# Patient Record
Sex: Female | Born: 1955 | Race: White | Hispanic: No | Marital: Married | Smoking: Never smoker
Health system: Southern US, Community
[De-identification: ages and names within clinical notes are randomized; demographics above are authoritative.]

## PROBLEM LIST (undated history)

## (undated) DIAGNOSIS — S2249XA Multiple fractures of ribs, unspecified side, initial encounter for closed fracture: Secondary | ICD-10-CM

## (undated) DIAGNOSIS — J939 Pneumothorax, unspecified: Secondary | ICD-10-CM

## (undated) DIAGNOSIS — Z89511 Acquired absence of right leg below knee: Secondary | ICD-10-CM

---

## 2000-06-18 ENCOUNTER — Encounter: Admission: RE | Admit: 2000-06-18 | Discharge: 2000-06-18 | Payer: Self-pay | Admitting: Family Medicine

## 2000-06-18 ENCOUNTER — Encounter: Payer: Self-pay | Admitting: Family Medicine

## 2001-01-27 ENCOUNTER — Emergency Department (HOSPITAL_COMMUNITY): Admission: EM | Admit: 2001-01-27 | Discharge: 2001-01-27 | Payer: Self-pay | Admitting: Emergency Medicine

## 2002-07-31 ENCOUNTER — Encounter: Payer: Self-pay | Admitting: Family Medicine

## 2002-07-31 ENCOUNTER — Encounter: Admission: RE | Admit: 2002-07-31 | Discharge: 2002-07-31 | Payer: Self-pay | Admitting: Family Medicine

## 2005-01-04 ENCOUNTER — Emergency Department (HOSPITAL_COMMUNITY): Admission: EM | Admit: 2005-01-04 | Discharge: 2005-01-05 | Payer: Self-pay | Admitting: Emergency Medicine

## 2006-07-04 ENCOUNTER — Ambulatory Visit (HOSPITAL_COMMUNITY): Admission: RE | Admit: 2006-07-04 | Discharge: 2006-07-04 | Payer: Self-pay | Admitting: Gastroenterology

## 2006-07-04 ENCOUNTER — Encounter (INDEPENDENT_AMBULATORY_CARE_PROVIDER_SITE_OTHER): Payer: Self-pay | Admitting: Gastroenterology

## 2007-01-17 ENCOUNTER — Observation Stay (HOSPITAL_COMMUNITY): Admission: EM | Admit: 2007-01-17 | Discharge: 2007-01-18 | Payer: Self-pay | Admitting: Emergency Medicine

## 2007-04-10 ENCOUNTER — Inpatient Hospital Stay (HOSPITAL_COMMUNITY): Admission: RE | Admit: 2007-04-10 | Discharge: 2007-04-12 | Payer: Self-pay | Admitting: Orthopaedic Surgery

## 2009-12-30 ENCOUNTER — Encounter
Admission: RE | Admit: 2009-12-30 | Discharge: 2010-02-15 | Payer: Self-pay | Source: Home / Self Care | Attending: Orthopedic Surgery | Admitting: Orthopedic Surgery

## 2010-01-17 ENCOUNTER — Encounter
Admission: RE | Admit: 2010-01-17 | Discharge: 2010-02-15 | Payer: Self-pay | Source: Home / Self Care | Attending: Orthopedic Surgery | Admitting: Orthopedic Surgery

## 2010-02-21 ENCOUNTER — Ambulatory Visit: Payer: BC Managed Care – PPO | Attending: Orthopedic Surgery | Admitting: Physical Therapy

## 2010-02-21 DIAGNOSIS — M2550 Pain in unspecified joint: Secondary | ICD-10-CM | POA: Insufficient documentation

## 2010-02-21 DIAGNOSIS — R269 Unspecified abnormalities of gait and mobility: Secondary | ICD-10-CM | POA: Insufficient documentation

## 2010-02-21 DIAGNOSIS — R5381 Other malaise: Secondary | ICD-10-CM | POA: Insufficient documentation

## 2010-02-21 DIAGNOSIS — S88119A Complete traumatic amputation at level between knee and ankle, unspecified lower leg, initial encounter: Secondary | ICD-10-CM | POA: Insufficient documentation

## 2010-02-21 DIAGNOSIS — IMO0001 Reserved for inherently not codable concepts without codable children: Secondary | ICD-10-CM | POA: Insufficient documentation

## 2010-02-28 ENCOUNTER — Encounter: Payer: Self-pay | Admitting: Physical Therapy

## 2010-05-31 NOTE — Op Note (Signed)
NAME:  Shannon Henderson, Shannon Henderson            ACCOUNT NO.:  [id number]   MEDICAL RECORD NO.:  [id number]          PATIENT TYPE:  AMB   LOCATION:  ENDO                         FACILITY:  [facility]   PHYSICIAN:  Anselmo Rod, M.D.  DATE OF BIRTH:  [birth year]   DATE OF PROCEDURE:  07/04/2006  DATE OF DISCHARGE:                               OPERATIVE REPORT   PROCEDURE PERFORMED:  Colonoscopy with cold biopsies x1.   ENDOSCOPIST:  Anselmo Rod, M.D.   INSTRUMENT USED:  Pentax video colonoscope.   INDICATIONS FOR PROCEDURE:  55 year old white female undergoing  screening colonoscopy to rule out colonic polyps, masses, etc.   PREPROCEDURE PREPARATION:  Informed consent was procured from the  patient. The patient fasted for 4 hours prior to the procedure and  prepped with 20 Osmo pills the night before and 12 Osmo pills the  morning of the procedure.  The risks and benefits of the procedure  including a 10% miss rate of cancer and polyp were discussed with the  patient, as well.   PREPROCEDURE PHYSICAL:  Patient with stable vital signs.  Neck supple.  Chest clear to auscultation.  S1 and S2 regular.  Abdomen soft with  normal bowel sounds.   DESCRIPTION OF PROCEDURE:  The patient was placed in the left lateral  decubitus position and sedated with 50 mcg of Fentanyl and 5 mg of  Versed given intravenously in slow incremental doses. Once the patient  was adequately sedated and maintained on low flow oxygen and continuous  cardiac monitoring, the Pentax video colonoscope was advanced from the  rectum to the cecum.  The appendiceal orifice and ileocecal valve were  clearly visualized and photographed.  The terminal ileum appeared  healthy without lesions.  There was extensive diverticulosis noted in  the sigmoid colon.  Diverticula were noted in the right colon, as well.  Small internal hemorrhoids were seen on retroflexion. A small erosion  was biopsied from the mid right colon (cold biopsies  x1).  The patient  tolerated the procedure well without complications.   IMPRESSION:  1. Large sigmoid diverticula present with a few right sided      diverticula.  2. Small internal hemorrhoids.  3. Small erosion biopsied from the mid right colon.  4. Normal terminal ileum.   RECOMMENDATIONS:  1. Await pathology results.  2. Repeat colonoscopy depending on pathology results.  3. Avoid all nonsteroidals including aspirin for the next two weeks.  4. Brochures on diverticulosis along with a high fiber diet have been      given to the patient for education.  5. Outpatient follow-up as need arises in the future.      Anselmo Rod, M.D.  Electronically Signed     JNM/MEDQ  D:  07/04/2006  T:  07/04/2006  Job:  [id number]   cc:   Marjory Lies, M.D.  Fax: [phone]

## 2010-05-31 NOTE — Op Note (Signed)
NAMEBEVERLIE, KURIHARA NO.:  [id number]   MEDICAL RECORD NO.:  [id number]          PATIENT TYPE:  INP   LOCATION:  5039                         FACILITY:  [facility]   PHYSICIAN:  Mark C. Ophelia Charter, M.D.    DATE OF BIRTH:  [birth year]   DATE OF PROCEDURE:  04/10/2007  DATE OF DISCHARGE:                               OPERATIVE REPORT   PRE AND POSTOPERATIVE DIAGNOSIS:  Right distal tibial shaft delayed  union.   PROCEDURE:  Right tibial nail with proximal and distal interlock.   SURGEON:  Annell Greening, MD   ANESTHESIA:  General plus Marcaine local.   TOURNIQUET TIME:  55 minutes.   ASSISTANT:  Maud Deed, PA-C   COMPONENTS USED:  Synthes 9 mm nail with proximal and distal interlocks.   PROCEDURE:  After induction of general anesthesia and orotracheal  intubation, proximal thigh tourniquet, the old fiberglass cast which had  been bivalved and taped had the duct tape cut.  Tape was removed and the  cast was removed.  Standard prep with DuraPrep was performed.  Usual  extremity sheets, drapes, impervious stockinette, Coban, sterile skin  marker and Betadine Vi-drape was applied.  At this point time-out  procedures check list was completed and confirmed.  Leg was wrapped in  Esmarch, tourniquet inflated.  Incision was made adjacent to patellar  tendon and superficial retinaculum was split.  Dissection down to the  anterior surface of the tibia and placing the entry hole just posterior  to the patellar tendon.  Checking under fluoroscopy, it was slightly  medial, wanted to be slightly more lateral and it was repositioned  slightly more lateral and then over reamed.  A beaded tip rod was placed  across fracture site down to the level of the ankle and then sequential  reaming up to 9.5 and a 9 mm nail was placed, based on measurements  allowing it to be countersunk.  Proximal interlocks were placed.  The  first one was transverse and the second was oblique starting  anterolateral.  The anterolateral screw as it was tightened down  actually sunk down through the anterior cortex of the tibia.  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  Screw was attempted to be backed  out under direct visualization with small sucker tip; however, was  wedged into the bone and was slightly sticking out on fluoroscopy  posterior medial but was not palpable through the skin with deep  palpation and was secure.  It was stable in spinning and left in its  current position.  If dynamization occurs then distal screws would need  to be removed later.  Distal interlocks were performed with freehand  technique aligning the fluoroscopy to round off the holes, then the  transverse screw was placed first from medial to lateral with careful  spreading to avoid any damage to the medial structures and then an AP  screw was placed again with spreading, using the skin protector, a drill  guide, depth gauge measurement and insertion of the distal screws.  All  interlock screws were checked AP and lateral and with pictures taken for  documentation.  Alignment and rotation was checked preoperatively with  the opposite leg to get her good symmetry.  Tourniquet was deflated.  Hemostasis was obtained.  Some bleeding was noted from the anterolateral  proximal interlock screw as expected.  Zero Vicryl was placed in the  superficial retinaculum, 2-0 Vicryl in subcutaneous tissue and 4-0  Vicryl subcuticular closure.  The interlock closure was done with  interlock screw  closure incisions were used with 2-0 Vicryl followed by skin staple.  4x4s, Webril and a posterior splint was applied.  The patient tolerated  procedure well, was transferred to recovery room, received Toradol and  will be placed on the PCA for pain control.      Mark C. Ophelia Charter, M.D.  Electronically Signed     MCY/MEDQ  D:  04/10/2007  T:  04/11/2007   Job:  [id number]

## 2010-06-01 ENCOUNTER — Other Ambulatory Visit: Payer: Self-pay | Admitting: Family Medicine

## 2010-06-01 DIAGNOSIS — Z1231 Encounter for screening mammogram for malignant neoplasm of breast: Secondary | ICD-10-CM

## 2010-06-09 ENCOUNTER — Ambulatory Visit
Admission: RE | Admit: 2010-06-09 | Discharge: 2010-06-09 | Disposition: A | Discharge status: Home / Self Care | Payer: BC Managed Care – PPO | Source: Ambulatory Visit | Attending: Family Medicine | Admitting: Family Medicine

## 2010-06-09 DIAGNOSIS — Z1231 Encounter for screening mammogram for malignant neoplasm of breast: Secondary | ICD-10-CM

## 2010-10-06 LAB — CBC
HCT: 39.6
Hemoglobin: 13.5
MCHC: 34
MCV: 95.9
Platelets: 194
RBC: 4.13
RDW: 13.3
WBC: 8.5

## 2010-10-06 LAB — COMPREHENSIVE METABOLIC PANEL
ALT: 78 — ABNORMAL HIGH
AST: 51 — ABNORMAL HIGH
Albumin: 3.8
Alkaline Phosphatase: 28 — ABNORMAL LOW
BUN: 7
CO2: 29
Calcium: 9.1
Chloride: 104
Creatinine, Ser: 0.78
GFR calc Af Amer: >60
GFR calc non Af Amer: >60
Glucose, Bld: 97
Potassium: 4.1
Sodium: 138
Total Bilirubin: 1.3 — ABNORMAL HIGH
Total Protein: 6.5

## 2010-10-06 LAB — URINALYSIS, ROUTINE W REFLEX MICROSCOPIC
Glucose, UA: NEGATIVE
Hgb urine dipstick: NEGATIVE
Ketones, ur: 15 — AB
Nitrite: NEGATIVE
Protein, ur: NEGATIVE
Specific Gravity, Urine: 1.027
Urobilinogen, UA: 0.2
pH: 6

## 2010-10-06 LAB — APTT: aPTT: 31

## 2010-10-06 LAB — PROTIME-INR
INR: 1
Prothrombin Time: 13.5

## 2010-10-10 LAB — COMPREHENSIVE METABOLIC PANEL
ALT: 62 — ABNORMAL HIGH
AST: 39 — ABNORMAL HIGH
Albumin: 3.8
Alkaline Phosphatase: 35 — ABNORMAL LOW
BUN: 11
CO2: 29
Calcium: 10
Chloride: 102
Creatinine, Ser: 0.8
GFR calc Af Amer: >60
GFR calc non Af Amer: >60
Glucose, Bld: 95
Potassium: 4.2
Sodium: 140
Total Bilirubin: 1.1
Total Protein: 7.4

## 2010-10-10 LAB — PROTIME-INR
INR: 1
Prothrombin Time: 13.1

## 2010-10-10 LAB — CBC
HCT: 41.5
Hemoglobin: 14.3
MCHC: 34.5
MCV: 95.4
Platelets: 243
RBC: 4.35
RDW: 13.5
WBC: 8.3

## 2010-10-10 LAB — APTT: aPTT: 30

## 2011-05-15 ENCOUNTER — Other Ambulatory Visit: Payer: Self-pay | Admitting: Family Medicine

## 2011-05-15 DIAGNOSIS — Z1231 Encounter for screening mammogram for malignant neoplasm of breast: Secondary | ICD-10-CM

## 2011-06-15 ENCOUNTER — Ambulatory Visit: Payer: BC Managed Care – PPO

## 2011-06-22 ENCOUNTER — Ambulatory Visit
Admission: RE | Admit: 2011-06-22 | Discharge: 2011-06-22 | Disposition: A | Discharge status: Home / Self Care | Payer: BC Managed Care – PPO | Source: Ambulatory Visit | Attending: Family Medicine | Admitting: Family Medicine

## 2011-06-22 DIAGNOSIS — Z1231 Encounter for screening mammogram for malignant neoplasm of breast: Secondary | ICD-10-CM

## 2012-07-30 ENCOUNTER — Other Ambulatory Visit: Payer: Self-pay

## 2012-07-30 DIAGNOSIS — Z1231 Encounter for screening mammogram for malignant neoplasm of breast: Secondary | ICD-10-CM

## 2012-08-22 ENCOUNTER — Ambulatory Visit
Admission: RE | Admit: 2012-08-22 | Discharge: 2012-08-22 | Disposition: A | Discharge status: Home / Self Care | Payer: BC Managed Care – PPO | Source: Ambulatory Visit

## 2012-08-22 DIAGNOSIS — Z1231 Encounter for screening mammogram for malignant neoplasm of breast: Secondary | ICD-10-CM

## 2013-05-09 ENCOUNTER — Other Ambulatory Visit: Payer: Self-pay | Admitting: Family Medicine

## 2013-05-09 ENCOUNTER — Other Ambulatory Visit: Payer: BC Managed Care – PPO

## 2013-05-09 DIAGNOSIS — N63 Unspecified lump in unspecified breast: Secondary | ICD-10-CM

## 2013-05-20 ENCOUNTER — Ambulatory Visit
Admission: RE | Admit: 2013-05-20 | Discharge: 2013-05-20 | Disposition: A | Discharge status: Home / Self Care | Payer: BC Managed Care – PPO | Source: Ambulatory Visit | Attending: Family Medicine | Admitting: Family Medicine

## 2013-05-20 DIAGNOSIS — N63 Unspecified lump in unspecified breast: Secondary | ICD-10-CM

## 2013-09-01 ENCOUNTER — Other Ambulatory Visit: Payer: Self-pay

## 2013-09-01 DIAGNOSIS — Z1231 Encounter for screening mammogram for malignant neoplasm of breast: Secondary | ICD-10-CM

## 2013-09-11 ENCOUNTER — Ambulatory Visit: Payer: BC Managed Care – PPO

## 2013-09-15 ENCOUNTER — Ambulatory Visit
Admission: RE | Admit: 2013-09-15 | Discharge: 2013-09-15 | Disposition: A | Discharge status: Home / Self Care | Payer: BC Managed Care – PPO | Source: Ambulatory Visit

## 2013-09-15 DIAGNOSIS — Z1231 Encounter for screening mammogram for malignant neoplasm of breast: Secondary | ICD-10-CM

## 2015-07-08 ENCOUNTER — Other Ambulatory Visit: Payer: Self-pay | Admitting: Family Medicine

## 2015-07-08 DIAGNOSIS — Z1231 Encounter for screening mammogram for malignant neoplasm of breast: Secondary | ICD-10-CM

## 2015-07-15 ENCOUNTER — Ambulatory Visit
Admission: RE | Admit: 2015-07-15 | Discharge: 2015-07-15 | Disposition: A | Discharge status: Home / Self Care | Payer: BLUE CROSS/BLUE SHIELD | Source: Ambulatory Visit | Attending: Family Medicine | Admitting: Family Medicine

## 2015-07-15 ENCOUNTER — Ambulatory Visit: Payer: Self-pay

## 2015-07-15 DIAGNOSIS — Z1231 Encounter for screening mammogram for malignant neoplasm of breast: Secondary | ICD-10-CM

## 2016-08-18 ENCOUNTER — Other Ambulatory Visit: Payer: Self-pay | Admitting: Family Medicine

## 2016-08-18 DIAGNOSIS — Z1231 Encounter for screening mammogram for malignant neoplasm of breast: Secondary | ICD-10-CM

## 2016-08-23 ENCOUNTER — Ambulatory Visit
Admission: RE | Admit: 2016-08-23 | Discharge: 2016-08-23 | Disposition: A | Discharge status: Home / Self Care | Payer: BLUE CROSS/BLUE SHIELD | Source: Ambulatory Visit | Attending: Family Medicine | Admitting: Family Medicine

## 2016-08-23 DIAGNOSIS — Z1231 Encounter for screening mammogram for malignant neoplasm of breast: Secondary | ICD-10-CM

## 2017-04-19 ENCOUNTER — Emergency Department (HOSPITAL_COMMUNITY): Payer: BLUE CROSS/BLUE SHIELD

## 2017-04-19 ENCOUNTER — Observation Stay (HOSPITAL_COMMUNITY)
Admission: EM | Admit: 2017-04-19 | Discharge: 2017-04-23 | Disposition: A | Discharge status: Home / Self Care | Payer: BLUE CROSS/BLUE SHIELD | Attending: General Surgery | Admitting: General Surgery

## 2017-04-19 ENCOUNTER — Encounter (HOSPITAL_COMMUNITY): Payer: Self-pay

## 2017-04-19 ENCOUNTER — Other Ambulatory Visit: Payer: Self-pay

## 2017-04-19 DIAGNOSIS — S22058A Other fracture of T5-T6 vertebra, initial encounter for closed fracture: Secondary | ICD-10-CM | POA: Insufficient documentation

## 2017-04-19 DIAGNOSIS — Z79899 Other long term (current) drug therapy: Secondary | ICD-10-CM | POA: Insufficient documentation

## 2017-04-19 DIAGNOSIS — S2249XA Multiple fractures of ribs, unspecified side, initial encounter for closed fracture: Secondary | ICD-10-CM | POA: Diagnosis present

## 2017-04-19 DIAGNOSIS — G43909 Migraine, unspecified, not intractable, without status migrainosus: Secondary | ICD-10-CM | POA: Insufficient documentation

## 2017-04-19 DIAGNOSIS — S270XXA Traumatic pneumothorax, initial encounter: Secondary | ICD-10-CM | POA: Insufficient documentation

## 2017-04-19 DIAGNOSIS — J939 Pneumothorax, unspecified: Secondary | ICD-10-CM

## 2017-04-19 DIAGNOSIS — Z89511 Acquired absence of right leg below knee: Secondary | ICD-10-CM | POA: Insufficient documentation

## 2017-04-19 DIAGNOSIS — S2239XA Fracture of one rib, unspecified side, initial encounter for closed fracture: Secondary | ICD-10-CM | POA: Diagnosis present

## 2017-04-19 DIAGNOSIS — S2242XA Multiple fractures of ribs, left side, initial encounter for closed fracture: Principal | ICD-10-CM | POA: Insufficient documentation

## 2017-04-19 DIAGNOSIS — S22009A Unspecified fracture of unspecified thoracic vertebra, initial encounter for closed fracture: Secondary | ICD-10-CM

## 2017-04-19 DIAGNOSIS — K5730 Diverticulosis of large intestine without perforation or abscess without bleeding: Secondary | ICD-10-CM | POA: Insufficient documentation

## 2017-04-19 DIAGNOSIS — Y9352 Activity, horseback riding: Secondary | ICD-10-CM | POA: Insufficient documentation

## 2017-04-19 HISTORY — DX: Pneumothorax, unspecified: J93.9

## 2017-04-19 HISTORY — DX: Multiple fractures of ribs, unspecified side, initial encounter for closed fracture: S22.49XA

## 2017-04-19 HISTORY — DX: Acquired absence of right leg below knee: Z89.511

## 2017-04-19 LAB — CBC
HCT: 40.1 % (ref 36.0–46.0)
HEMOGLOBIN: 13.4 g/dL (ref 12.0–15.0)
MCH: 32.1 pg (ref 26.0–34.0)
MCHC: 33.4 g/dL (ref 30.0–36.0)
MCV: 96.2 fL (ref 78.0–100.0)
Platelets: 221 10*3/uL (ref 150–400)
RBC: 4.17 MIL/uL (ref 3.87–5.11)
RDW: 13.8 % (ref 11.5–15.5)
WBC: 7.7 10*3/uL (ref 4.0–10.5)

## 2017-04-19 LAB — CBC WITH DIFFERENTIAL/PLATELET
Basophils Absolute: 0 10*3/uL (ref 0.0–0.1)
Basophils Relative: 0 %
Eosinophils Absolute: 0 10*3/uL (ref 0.0–0.7)
Eosinophils Relative: 0 %
HCT: 43.2 % (ref 36.0–46.0)
Hemoglobin: 14.4 g/dL (ref 12.0–15.0)
Lymphocytes Relative: 8 %
Lymphs Abs: 0.9 10*3/uL (ref 0.7–4.0)
MCH: 32.2 pg (ref 26.0–34.0)
MCHC: 33.3 g/dL (ref 30.0–36.0)
MCV: 96.6 fL (ref 78.0–100.0)
Monocytes Absolute: 0.7 10*3/uL (ref 0.1–1.0)
Monocytes Relative: 6 %
Neutro Abs: 9.9 10*3/uL — ABNORMAL HIGH (ref 1.7–7.7)
Neutrophils Relative %: 86 %
Platelets: 231 10*3/uL (ref 150–400)
RBC: 4.47 MIL/uL (ref 3.87–5.11)
RDW: 13.4 % (ref 11.5–15.5)
WBC: 11.5 10*3/uL — ABNORMAL HIGH (ref 4.0–10.5)

## 2017-04-19 LAB — BASIC METABOLIC PANEL
Anion gap: 12 (ref 5–15)
BUN: 10 mg/dL (ref 6–20)
CO2: 23 mmol/L (ref 22–32)
Calcium: 9.3 mg/dL (ref 8.9–10.3)
Chloride: 105 mmol/L (ref 101–111)
Creatinine, Ser: 0.9 mg/dL (ref 0.44–1.00)
GFR calc Af Amer: >60 mL/min (ref >60)
GFR calc non Af Amer: >60 mL/min (ref >60)
Glucose, Bld: 90 mg/dL (ref 65–99)
Potassium: 4.2 mmol/L (ref 3.5–5.1)
Sodium: 140 mmol/L (ref 135–145)

## 2017-04-19 LAB — CREATININE, SERUM: Creatinine, Ser: 0.82 mg/dL (ref 0.44–1.00)

## 2017-04-19 LAB — I-STAT CHEM 8, ED
BUN: 12 mg/dL (ref 6–20)
Calcium, Ion: 1.12 mmol/L — ABNORMAL LOW (ref 1.15–1.40)
Chloride: 103 mmol/L (ref 101–111)
Creatinine, Ser: 1 mg/dL (ref 0.44–1.00)
Glucose, Bld: 87 mg/dL (ref 65–99)
HCT: 43 % (ref 36.0–46.0)
Hemoglobin: 14.6 g/dL (ref 12.0–15.0)
Potassium: 4.2 mmol/L (ref 3.5–5.1)
Sodium: 142 mmol/L (ref 135–145)
TCO2: 26 mmol/L (ref 22–32)

## 2017-04-19 LAB — LIPASE, BLOOD: Lipase: 64 U/L — ABNORMAL HIGH (ref 11–51)

## 2017-04-19 LAB — I-STAT TROPONIN, ED: TROPONIN I, POC: 0 ng/mL (ref 0.00–0.08)

## 2017-04-19 LAB — MRSA PCR SCREENING: MRSA by PCR: NEGATIVE

## 2017-04-19 MED ORDER — METOPROLOL TARTRATE 5 MG/5ML IV SOLN: 5.0000 mg | Freq: Four times a day (QID) | INTRAVENOUS | Status: DC | PRN

## 2017-04-19 MED ORDER — SODIUM CHLORIDE 0.9% FLUSH
3.0000 mL | Freq: Two times a day (BID) | INTRAVENOUS | Status: DC
  Administered 2017-04-19 – 2017-04-21 (×4): 3 mL via INTRAVENOUS
  Administered 2017-04-21: 10:00:00 via INTRAVENOUS
  Administered 2017-04-22 – 2017-04-23 (×3): 3 mL via INTRAVENOUS

## 2017-04-19 MED ORDER — MORPHINE SULFATE (PF) 4 MG/ML IV SOLN: 1.0000 mg | INTRAVENOUS | Status: DC | PRN

## 2017-04-19 MED ORDER — SODIUM CHLORIDE 0.9% FLUSH: 3.0000 mL | INTRAVENOUS | Status: DC | PRN

## 2017-04-19 MED ORDER — ONDANSETRON 4 MG PO TBDP: 4.0000 mg | ORAL_TABLET | Freq: Four times a day (QID) | ORAL | Status: DC | PRN

## 2017-04-19 MED ORDER — TRAMADOL HCL 50 MG PO TABS: 50.0000 mg | ORAL_TABLET | Freq: Four times a day (QID) | ORAL | Status: DC | PRN

## 2017-04-19 MED ORDER — ENOXAPARIN SODIUM 40 MG/0.4ML ~~LOC~~ SOLN
40.0000 mg | SUBCUTANEOUS | Status: DC
  Administered 2017-04-20 – 2017-04-22 (×3): 40 mg via SUBCUTANEOUS
  Filled 2017-04-19 (×4): qty 0.4

## 2017-04-19 MED ORDER — KETOROLAC TROMETHAMINE 10 MG PO TABS
10.0000 mg | ORAL_TABLET | Freq: Four times a day (QID) | ORAL | Status: DC
  Administered 2017-04-19 – 2017-04-23 (×13): 10 mg via ORAL
  Filled 2017-04-19 (×16): qty 1

## 2017-04-19 MED ORDER — METHOCARBAMOL 500 MG PO TABS
500.0000 mg | ORAL_TABLET | Freq: Three times a day (TID) | ORAL | Status: DC
  Administered 2017-04-19 – 2017-04-23 (×12): 500 mg via ORAL
  Filled 2017-04-19 (×13): qty 1

## 2017-04-19 MED ORDER — PANTOPRAZOLE SODIUM 40 MG PO TBEC
40.0000 mg | DELAYED_RELEASE_TABLET | Freq: Every day | ORAL | Status: DC
  Administered 2017-04-20 – 2017-04-23 (×4): 40 mg via ORAL
  Filled 2017-04-19 (×4): qty 1

## 2017-04-19 MED ORDER — IOPAMIDOL (ISOVUE-300) INJECTION 61%
INTRAVENOUS | Status: AC
  Administered 2017-04-19: 100 mL
  Filled 2017-04-19: qty 100

## 2017-04-19 MED ORDER — SUMATRIPTAN SUCCINATE 50 MG PO TABS
50.0000 mg | ORAL_TABLET | ORAL | Status: DC | PRN
  Administered 2017-04-20: 50 mg via ORAL
  Filled 2017-04-19 (×2): qty 1

## 2017-04-19 MED ORDER — ACETAMINOPHEN 500 MG PO TABS
1000.0000 mg | ORAL_TABLET | Freq: Three times a day (TID) | ORAL | Status: DC
  Administered 2017-04-19 – 2017-04-20 (×3): 1000 mg via ORAL
  Filled 2017-04-19 (×3): qty 2

## 2017-04-19 MED ORDER — SODIUM CHLORIDE 0.9 % IV SOLN: 250.0000 mL | INTRAVENOUS | Status: DC | PRN

## 2017-04-19 MED ORDER — DOCUSATE SODIUM 100 MG PO CAPS
100.0000 mg | ORAL_CAPSULE | Freq: Two times a day (BID) | ORAL | Status: DC
  Administered 2017-04-19 – 2017-04-22 (×6): 100 mg via ORAL
  Filled 2017-04-19 (×8): qty 1

## 2017-04-19 MED ORDER — OXYCODONE HCL 5 MG PO TABS
5.0000 mg | ORAL_TABLET | ORAL | Status: DC | PRN
  Administered 2017-04-19 (×2): 5 mg via ORAL
  Administered 2017-04-21: 10 mg via ORAL
  Filled 2017-04-19: qty 2
  Filled 2017-04-19 (×2): qty 1

## 2017-04-19 MED ORDER — ONDANSETRON HCL 4 MG/2ML IJ SOLN: 4.0000 mg | Freq: Four times a day (QID) | INTRAMUSCULAR | Status: DC | PRN

## 2017-04-19 MED ORDER — PANTOPRAZOLE SODIUM 40 MG IV SOLR
40.0000 mg | Freq: Every day | INTRAVENOUS | Status: DC
  Administered 2017-04-19: 40 mg via INTRAVENOUS
  Filled 2017-04-19: qty 40

## 2017-04-19 NOTE — H&P (Signed)
[facility] Admission Note  Shannon Henderson [birth year]  [id number].    Chief Complaint/Reason for Consult: Fall from horse HPI:  Patient was riding her horse earlier today when a squirrel came by and the horse took off and she fell from the horse. Fell on her left side, and immediately felt like she'd had the wind knocked out of her. She complains of pain in her left chest and left low back. Patient reports shortness of breath has improved at time of exam, oxygen saturation 100% on room air. 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 Takes no daily medications, denies allergies to any medications. Denies alcohol, tobacco or illicit drug use. 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   ROS: Review of Systems  Constitutional: Negative for chills and fever.  HENT: Negative for sore throat and tinnitus.   Eyes: Negative for blurred vision and double vision.  Respiratory: Positive for shortness of breath. Negative for wheezing and stridor.   Cardiovascular: Positive for chest pain. Negative for palpitations.  Gastrointestinal: Negative for abdominal pain, constipation, diarrhea, nausea and vomiting.  Genitourinary: Negative for dysuria, frequency and urgency.  Musculoskeletal: Negative for back pain and neck pain.  Neurological: Negative for dizziness, tingling, focal weakness and loss of consciousness.  All other systems reviewed and are negative.   No family history on file.  Past Medical History:  Diagnosis Date  . Amputee, below knee, right ([place])     History reviewed. No pertinent surgical history.  Social History:  has no tobacco, alcohol, and drug history on file.  Allergies: No Known Allergies   (Not in a hospital admission)  Blood pressure 120/71, pulse 80, temperature 98.1 F (36.7 C), temperature source Oral, resp. rate 18, height 6'  1" (1.854 m), weight 71.2 kg (157 lb), SpO2 100 %. Physical Exam: Physical Exam  Constitutional: She is oriented to person, place, and time. She appears well-developed and well-nourished. She is cooperative.  Non-toxic appearance. No distress.  HENT:  Head: Normocephalic and atraumatic. Head is without raccoon's eyes and without Battle's sign.  Right Ear: Tympanic membrane, external ear and ear canal normal.  Left Ear: Tympanic membrane, external ear and ear canal normal.  Nose: Nose normal. No nasal septal hematoma. No epistaxis.  Mouth/Throat: Oropharynx is clear and moist and mucous membranes are normal.  Eyes: Pupils are equal, round, and reactive to light. Conjunctivae, EOM and lids are normal. No scleral icterus.  Neck: Normal range of motion and phonation normal. Neck supple. No spinous process tenderness and no muscular tenderness present. No thyromegaly present.  Cardiovascular: Normal rate and regular rhythm.  Pulses:      Radial pulses are 2+ on the right side, and 2+ on the left side.       Right popliteal pulse not accessible.       Dorsalis pedis pulses are 2+ on the left side.  Pulmonary/Chest: Effort normal. No accessory muscle usage or stridor. No tachypnea. She has decreased breath sounds (mildly ) in the left upper field. She has no wheezes. She has no rhonchi. She has no rales.  Abdominal: Soft. Bowel sounds are normal. She exhibits no distension. There is no hepatosplenomegaly. There is no tenderness. There is no rigidity, no rebound and no guarding.  Musculoskeletal:  ROM grossly intact in bilateral upper extremities. ROM grossly intact in bilateral hips and  L knee/ankle. Prosthetic present for R BKA.   Neurological: She is alert and oriented to person, place, and time. She has normal strength. No cranial nerve deficit or sensory deficit. GCS eye subscore is 4. GCS verbal subscore is 5. GCS motor subscore is 6.  Skin: Skin is warm, dry and intact. She is not diaphoretic.   Psychiatric: Her speech is normal and behavior is normal. Her affect is blunt.    Results for orders placed or performed during the hospital encounter of 04/19/17 (from the past 48 hour(s))  Basic metabolic panel     Status: None   Collection Time: 04/19/17  1:35 PM  Result Value Ref Range   Sodium 140 135 - 145 mmol/L   Potassium 4.2 3.5 - 5.1 mmol/L   Chloride 105 101 - 111 mmol/L   CO2 23 22 - 32 mmol/L   Glucose, Bld 90 65 - 99 mg/dL   BUN 10 6 - 20 mg/dL   Creatinine, Ser 0.90 0.44 - 1.00 mg/dL   Calcium 9.3 8.9 - 10.3 mg/dL   GFR calc non Af Amer >60 >60 mL/min   GFR calc Af Amer >60 >60 mL/min    Comment: (NOTE) The eGFR has been calculated using the CKD EPI equation. This calculation has not been validated in all clinical situations. eGFR's persistently <60 mL/min signify possible Chronic Kidney Disease.    Anion gap 12 5 - 15    Comment: Performed at [facility] [address] [place], [place] [address]  CBC with Differential     Status: Abnormal   Collection Time: 04/19/17  1:35 PM  Result Value Ref Range   WBC 11.5 (H) 4.0 - 10.5 K/uL   RBC 4.47 3.87 - 5.11 MIL/uL   Hemoglobin 14.4 12.0 - 15.0 g/dL   HCT 43.2 36.0 - 46.0 %   MCV 96.6 78.0 - 100.0 fL   MCH 32.2 26.0 - 34.0 pg   MCHC 33.3 30.0 - 36.0 g/dL   RDW 13.4 11.5 - 15.5 %   Platelets 231 150 - 400 K/uL   Neutrophils Relative % 86 %   Neutro Abs 9.9 (H) 1.7 - 7.7 K/uL   Lymphocytes Relative 8 %   Lymphs Abs 0.9 0.7 - 4.0 K/uL   Monocytes Relative 6 %   Monocytes Absolute 0.7 0.1 - 1.0 K/uL   Eosinophils Relative 0 %   Eosinophils Absolute 0.0 0.0 - 0.7 K/uL   Basophils Relative 0 %   Basophils Absolute 0.0 0.0 - 0.1 K/uL    Comment: Performed at [facility] [address], [place], [place] [address]  I-stat Chem 8, ED     Status: Abnormal   Collection Time: 04/19/17  1:41 PM  Result Value Ref Range   Sodium 142 135 - 145 mmol/L   Potassium 4.2 3.5 - 5.1 mmol/L   Chloride 103 101  - 111 mmol/L   BUN 12 6 - 20 mg/dL   Creatinine, Ser 1.00 0.44 - 1.00 mg/dL   Glucose, Bld 87 65 - 99 mg/dL   Calcium, Ion 1.12 (L) 1.15 - 1.40 mmol/L   TCO2 26 22 - 32 mmol/L   Hemoglobin 14.6 12.0 - 15.0 g/dL   HCT 43.0 36.0 - 46.0 %   Dg Ribs Unilateral W/chest Left  Result Date: 04/19/2017 CLINICAL DATA:  [place] off a horse today, LEFT side pain, LEFT shoulder pain, history of prior LEFT rib fractures EXAM: LEFT RIBS AND CHEST - 3+ VIEW COMPARISON:  04/10/2007 FINDINGS: Normal heart size, mediastinal contours, and pulmonary vascularity. Lungs emphysematous but clear. No acute infiltrate or pleural effusion. Small LEFT apex pneumothorax. Bones demineralized. BB placed at site of symptoms lower LEFT chest. Mildly displaced fractures identified at lateral LEFT seventh and eighth ribs. Questionable subtle contour abnormality at the lateral LEFT sixth rib without cortical disruption. Nondisplaced fracture LEFT fifth rib. IMPRESSION: Fractures of the LEFT seventh, eighth, and fifth ribs. Small LEFT apex pneumothorax. Critical Value/emergent results were called by telephone at the time of interpretation on 04/19/2017 at 1301 hrs to Dr. Ellender Hose, who verbally acknowledged these results. Electronically Signed   By: Lavonia Dana M.D.   On: 04/19/2017 13:02   Ct Chest W Contrast  Addendum Date: 04/19/2017   ADDENDUM REPORT: 04/19/2017 15:25 ADDENDUM: Study discussed by telephone with PA-C MINA FAWZE on 04/19/2017 at 1522 hours. Electronically Signed   By: Genevie Ann M.D.   On: 04/19/2017 15:25   Result Date: 04/19/2017 CLINICAL DATA:  62 year old female was bucked off of a horse when it was route. Left rib fractures. EXAM: CT CHEST, ABDOMEN, AND PELVIS WITH CONTRAST TECHNIQUE: Multidetector CT imaging of the chest, abdomen and pelvis was performed following the standard protocol during bolus administration of intravenous contrast. CONTRAST:  154m ISOVUE-300 IOPAMIDOL (ISOVUE-300) INJECTION 61% COMPARISON:  Chest and  left rib radiographs 1241 hours today. FINDINGS: CT CHEST FINDINGS Cardiovascular: Normal thoracic aorta. Normal proximal great vessels. The other major mediastinal vascular structures appear intact. No cardiomegaly. No pericardial effusion. Mediastinum/Nodes: No mediastinal hematoma.  No lymphadenopathy. Lungs/Pleura: Small left pneumothorax, most apparent at the medial left cardiophrenic angle on series 4, image 117. trace left pleural effusion or hemothorax. There is left lung atelectasis but no pulmonary contusion. The major airways are patent. Mild dependent right lung atelectasis, but no right pneumothorax or pleural effusion. Musculoskeletal: There are nondisplaced fractures of the posterior left 5th, 6th, 7th posterior ribs. There are minimally to nondisplaced fractures of the left costovertebral junctions at the 9th, 10th levels. There are associated nondisplaced fractures through the left 6th and 7th thoracic vertebral transverse processes. There is a comminuted and mildly displaced fracture of the left lateral seventh rib, with associated small volume adjacent pleural hemorrhaging gas (series 4, image 91). There is a minimally displaced fracture of the left lateral 8th rib. There is probably a nondisplaced fracture of the left lateral 6th rib. No thoracic vertebral body or other posterior element fracture. No acute right rib fracture. No sternal fracture. Visible shoulder structures appear intact. CT ABDOMEN PELVIS FINDINGS Hepatobiliary: The liver appears intact with a small 16 millimeter hypodense area in the posterior right dome with peripheral nodular enhancement most compatible with a benign hepatic hemangioma. Normal liver enhancement elsewhere. No perihepatic fluid. Negative gallbladder. Pancreas: Negative. Spleen: Intact, negative. Adrenals/Urinary Tract: Normal adrenal glands. Bilateral renal enhancement and contrast excretion is symmetric and normal. The left renal collecting system and ureter  are duplicated (normal variant). Diminutive and unremarkable urinary bladder. Occasional pelvic phleboliths. Stomach/Bowel: Negative rectum aside from retained stool. There is moderate to severe sigmoid colon diverticulosis, as well as retained stool, but no active inflammation. Mild diverticulosis in the left colon and at the splenic flexure without active inflammation. Mildly redundant transverse colon. There is mild diverticulosis of the right colon without active inflammation. Normal cecum and appendix. Negative terminal ileum. No dilated or abnormal small bowel loops. Largely decompressed and negative stomach and duodenum. No abdominal free air, or free fluid. Vascular/Lymphatic: The abdominal aorta and other major  arterial structures in the abdomen and pelvis are patent and appear normal. The portal venous system is patent. No lymphadenopathy. Reproductive: A round 3.3 centimeter exophytic mass from the uterus directed posteriorly is probably a benign fibroid. Negative ovaries. Other: No pelvic free fluid. No superficial body wall injury identified. Musculoskeletal: The lumbar vertebrae appear intact. The sacrum and SI joints appear intact. No pelvis fracture identified. Proximal femurs appear intact. IMPRESSION: 1. Small left pneumothorax and trace left hemothorax. No pulmonary contusion. 2. Multiple left rib fractures: Ribs 5 through 10. Ribs 6 and 7 are fractured in two places. 3. Incidental associated nondisplaced fractures through the tips of the 6th and 7th thoracic vertebral transverse processes. No other thoracic vertebral fracture. 4. No other acute traumatic injury identified in the chest, abdomen, or pelvis. 5. Probable 3.3 cm uterine fibroid directed into the posterior pelvis on the right. 6. Severe diverticulosis of the sigmoid colon, and mild large bowel diverticulosis elsewhere. Electronically Signed: By: Genevie Ann M.D. On: 04/19/2017 15:16   Ct Abdomen Pelvis W Contrast  Addendum Date:  04/19/2017   ADDENDUM REPORT: 04/19/2017 15:25 ADDENDUM: Study discussed by telephone with PA-C MINA FAWZE on 04/19/2017 at 1522 hours. Electronically Signed   By: Genevie Ann M.D.   On: 04/19/2017 15:25   Result Date: 04/19/2017 CLINICAL DATA:  62 year old female was bucked off of a horse when it was route. Left rib fractures. EXAM: CT CHEST, ABDOMEN, AND PELVIS WITH CONTRAST TECHNIQUE: Multidetector CT imaging of the chest, abdomen and pelvis was performed following the standard protocol during bolus administration of intravenous contrast. CONTRAST:  131m ISOVUE-300 IOPAMIDOL (ISOVUE-300) INJECTION 61% COMPARISON:  Chest and left rib radiographs 1241 hours today. FINDINGS: CT CHEST FINDINGS Cardiovascular: Normal thoracic aorta. Normal proximal great vessels. The other major mediastinal vascular structures appear intact. No cardiomegaly. No pericardial effusion. Mediastinum/Nodes: No mediastinal hematoma.  No lymphadenopathy. Lungs/Pleura: Small left pneumothorax, most apparent at the medial left cardiophrenic angle on series 4, image 117. trace left pleural effusion or hemothorax. There is left lung atelectasis but no pulmonary contusion. The major airways are patent. Mild dependent right lung atelectasis, but no right pneumothorax or pleural effusion. Musculoskeletal: There are nondisplaced fractures of the posterior left 5th, 6th, 7th posterior ribs. There are minimally to nondisplaced fractures of the left costovertebral junctions at the 9th, 10th levels. There are associated nondisplaced fractures through the left 6th and 7th thoracic vertebral transverse processes. There is a comminuted and mildly displaced fracture of the left lateral seventh rib, with associated small volume adjacent pleural hemorrhaging gas (series 4, image 91). There is a minimally displaced fracture of the left lateral 8th rib. There is probably a nondisplaced fracture of the left lateral 6th rib. No thoracic vertebral body or other  posterior element fracture. No acute right rib fracture. No sternal fracture. Visible shoulder structures appear intact. CT ABDOMEN PELVIS FINDINGS Hepatobiliary: The liver appears intact with a small 16 millimeter hypodense area in the posterior right dome with peripheral nodular enhancement most compatible with a benign hepatic hemangioma. Normal liver enhancement elsewhere. No perihepatic fluid. Negative gallbladder. Pancreas: Negative. Spleen: Intact, negative. Adrenals/Urinary Tract: Normal adrenal glands. Bilateral renal enhancement and contrast excretion is symmetric and normal. The left renal collecting system and ureter are duplicated (normal variant). Diminutive and unremarkable urinary bladder. Occasional pelvic phleboliths. Stomach/Bowel: Negative rectum aside from retained stool. There is moderate to severe sigmoid colon diverticulosis, as well as retained stool, but no active inflammation. Mild diverticulosis in the left colon and  at the splenic flexure without active inflammation. Mildly redundant transverse colon. There is mild diverticulosis of the right colon without active inflammation. Normal cecum and appendix. Negative terminal ileum. No dilated or abnormal small bowel loops. Largely decompressed and negative stomach and duodenum. No abdominal free air, or free fluid. Vascular/Lymphatic: The abdominal aorta and other major arterial structures in the abdomen and pelvis are patent and appear normal. The portal venous system is patent. No lymphadenopathy. Reproductive: A round 3.3 centimeter exophytic mass from the uterus directed posteriorly is probably a benign fibroid. Negative ovaries. Other: No pelvic free fluid. No superficial body wall injury identified. Musculoskeletal: The lumbar vertebrae appear intact. The sacrum and SI joints appear intact. No pelvis fracture identified. Proximal femurs appear intact. IMPRESSION: 1. Small left pneumothorax and trace left hemothorax. No pulmonary  contusion. 2. Multiple left rib fractures: Ribs 5 through 10. Ribs 6 and 7 are fractured in two places. 3. Incidental associated nondisplaced fractures through the tips of the 6th and 7th thoracic vertebral transverse processes. No other thoracic vertebral fracture. 4. No other acute traumatic injury identified in the chest, abdomen, or pelvis. 5. Probable 3.3 cm uterine fibroid directed into the posterior pelvis on the right. 6. Severe diverticulosis of the sigmoid colon, and mild large bowel diverticulosis elsewhere. Electronically Signed: By: Genevie Ann M.D. On: 04/19/2017 15:16      Assessment/Plan Fall from horse Left rib fractures 5-10 with small pneumothorax - pulm toilet, IS, pain control - repeat CXR in AM - PT/OT Left T6-7 TVP fractures - PT/OT, pain control Hx of migraines - patient reports taking zomig for migraines, have ordered sumatriptan prn as a substitute  FEN: reg diet, saline lock IV VTE: SCD, lovenox ID: no abx indicated   Brigid Re, [facility] 04/19/2017, 4:13 PM Pager: [phone] Trauma: [phone] Mon-Fri 7:00 am-4:30 pm Sat-Sun 7:00 am-11:30 am

## 2017-04-19 NOTE — ED Triage Notes (Signed)
Per [facility]: Pt from drs office, around 9 am pt fell off the side of the horse, wasn't thrown. Pt was wearing a helmet, no LOC, no head injury. Pt does have pain in her back, thoracic down to lumbar. No neck pain. No spine tenderness, pain seems to be to the left of the spine. Pts main complaint is that she has pain on respiration on the left side. No crepitus, pushing on left side of ribs did not reveal any deformity. Pt received 60 mg toradol IM at the doctors office. Pain went from 10/10 to 5/10. Pt declined pain medication en route.

## 2017-04-19 NOTE — ED Provider Notes (Signed)
[facility] EMERGENCY DEPARTMENT Provider Note   CSN: [id number] Arrival date & time: 04/19/17  1136     History   Chief Complaint Chief Complaint  Patient presents with  . Fall    From horse     HPI Shannon Henderson is a 62 y.o. female with history of right BKA presents today for evaluation of acute onset, constant left-sided thoracic back pain secondary to injury earlier today.  She states that at around 9 AM she was riding a horse who was spooked by a squirrel and veered to the right.  She states that she fell off of the horse and landed on her left scapula and the left side of her back.  She was wearing a helmet and she denies head injury or loss of consciousness.  She denies headache, vision changes, neck pain.  She denies nausea, vomiting, abdominal pain.  She notes constant sharp aching pain just lateral to her thoracic spine on the left side which worsens with any kind of movement, deep breathing, or movement of her left arm.  The pain will radiate to the anterior aspect of the chest wall as well.  She also endorses shortness of breath which has been improving.  She went to her primary care physician earlier today for evaluation who gave her 60 mg of Toradol IM and instructed her to present to the ED for further evaluation.  She states that her pain has improved somewhat.  She denies numbness, tingling, weakness, low back pain, saddle anesthesia, bowel or bladder incontinence, or IV drug use.  She is not on blood thinners.  She has ambulated since the fall without difficulty.   The history is provided by the patient.    Past Medical History:  Diagnosis Date  . Amputee, below knee, right (HCC)     There are no active problems to display for this patient.   History reviewed. No pertinent surgical history.   OB History   None      Home Medications    Prior to Admission medications   Not on File    Family History No family history on file.  Social  History Social History   Tobacco Use  . Smoking status: Not on file  Substance Use Topics  . Alcohol use: Not on file  . Drug use: Not on file     Allergies   Patient has no known allergies.   Review of Systems Review of Systems  Constitutional: Negative for chills and fever.  Eyes: Negative for visual disturbance.  Respiratory: Positive for shortness of breath.   Cardiovascular: Positive for chest pain.  Gastrointestinal: Negative for nausea and vomiting.  Musculoskeletal: Positive for back pain. Negative for myalgias.  Neurological: Negative for syncope, weakness, numbness and headaches.  All other systems reviewed and are negative.    Physical Exam Updated Vital Signs BP 120/71 (BP Location: Left Arm)   Pulse 80   Temp 98.1 F (36.7 C) (Oral)   Resp 18   Ht 6\' 1"  (1.854 m)   Wt 71.2 kg (157 lb)   SpO2 100%   BMI 20.71 kg/m    Physical Exam  Constitutional: She appears well-developed and well-nourished. No distress.  HENT:  Head: Normocephalic.  No Battle's signs, no raccoon's eyes, no rhinorrhea. No tenderness to palpation of the face or skull. No deformity, crepitus, or swelling noted.   Eyes: Pupils are equal, round, and reactive to light. Conjunctivae and EOM are normal. Right eye exhibits no  discharge. Left eye exhibits no discharge.  2+ radial pulses bilaterally and left DP/PT pulses, negative Homan's bl   Neck: Normal range of motion. Neck supple. No JVD present. No tracheal deviation present.  No midline spine TTP, no paraspinal muscle tenderness, no deformity, crepitus, or step-off noted   Cardiovascular: Normal rate, regular rhythm, normal heart sounds and intact distal pulses.  Pulmonary/Chest: Effort normal. She exhibits tenderness.  Equal rise and fall of chest.  Patient taking relatively shallow breaths secondary to pain.  Palpation of the left lateral and anterior chest wall results in pain in the chest wall posteriorly.  She has focal  tenderness to palpation in the lower thoracic area on the left side along the left transverse processes; mild crepitus noted; there is no deformity,  ecchymosis, or observable flail segment.  Abdominal: Soft. Bowel sounds are normal. She exhibits no distension. There is no tenderness.  Musculoskeletal: She exhibits tenderness. She exhibits no edema.  No midline spine TTP, left parathoracic muscle tenderness in the lower thoracic region, no deformity, crepitus, or step-off noted.  Status post right BKA.  5/5 strength of BUE and LLE major muscle groups as well as right hip flexors.  There is no pelvic instability on palpation or pain on palpation of the pelvis.   Lymphadenopathy:    She has no cervical adenopathy.  Neurological: She is alert.  Fluent speech, no facial droop, sensation intact to soft touch of bilateral upper and lower extremities as well as the face.  Cranial nerves II through XII tested and intact.  Skin: Skin is warm and dry. No erythema.  Psychiatric: She has a normal mood and affect. Her behavior is normal.  Nursing note and vitals reviewed.    ED Treatments / Results  Labs (all labs ordered are listed, but only abnormal results are displayed) Labs Reviewed  CBC WITH DIFFERENTIAL/PLATELET - Abnormal; Notable for the following components:      Result Value   WBC 11.5 (*)    Neutro Abs 9.9 (*)    All other components within normal limits  I-STAT CHEM 8, ED - Abnormal; Notable for the following components:   Calcium, Ion 1.12 (*)    All other components within normal limits  BASIC METABOLIC PANEL  LIPASE, BLOOD  I-STAT TROPONIN, ED    EKG EKG Interpretation  Date/Time:  Thursday April 19 2017 15:03:39 EDT Ventricular Rate:  71 PR Interval:    QRS Duration: 77 QT Interval:  428 QTC Calculation: 466 R Axis:   66 Text Interpretation:  Sinus rhythm Anteroseptal infarct, age indeterminate ST elevation, consider inferior injury Baseline wander in lead(s) V3 No old  tracing to compare Confirmed by Rolan BuccoBelfi, Melanie [phone]([id number]) on 04/19/2017 3:16:25 PM   Radiology Dg Ribs Unilateral W/chest Left  Result Date: 04/19/2017 CLINICAL DATA:  Larey SeatFell off a horse today, LEFT side pain, LEFT shoulder pain, history of prior LEFT rib fractures EXAM: LEFT RIBS AND CHEST - 3+ VIEW COMPARISON:  04/10/2007 FINDINGS: Normal heart size, mediastinal contours, and pulmonary vascularity. Lungs emphysematous but clear. No acute infiltrate or pleural effusion. Small LEFT apex pneumothorax. Bones demineralized. BB placed at site of symptoms lower LEFT chest. Mildly displaced fractures identified at lateral LEFT seventh and eighth ribs. Questionable subtle contour abnormality at the lateral LEFT sixth rib without cortical disruption. Nondisplaced fracture LEFT fifth rib. IMPRESSION: Fractures of the LEFT seventh, eighth, and fifth ribs. Small LEFT apex pneumothorax. Critical Value/emergent results were called by telephone at the time of interpretation on 04/19/2017  at 1301 hrs to Dr. Erma Heritage, who verbally acknowledged these results. Electronically Signed   By: Ulyses Southward M.D.   On: 04/19/2017 13:02   Ct Chest W Contrast  Addendum Date: 04/19/2017   ADDENDUM REPORT: 04/19/2017 15:25 ADDENDUM: Study discussed by telephone with PA-C Danyael Alipio on 04/19/2017 at 1522 hours. Electronically Signed   By: Odessa Fleming M.D.   On: 04/19/2017 15:25   Result Date: 04/19/2017 CLINICAL DATA:  62 year old female was bucked off of a horse when it was route. Left rib fractures. EXAM: CT CHEST, ABDOMEN, AND PELVIS WITH CONTRAST TECHNIQUE: Multidetector CT imaging of the chest, abdomen and pelvis was performed following the standard protocol during bolus administration of intravenous contrast. CONTRAST:  ISOVUE-300 IOPAMIDOL (ISOVUE-300) INJECTION 61% COMPARISON:  Chest and left rib radiographs 1241 hours today. FINDINGS: CT CHEST FINDINGS Cardiovascular: Normal thoracic aorta. Normal proximal great vessels. The other major  mediastinal vascular structures appear intact. No cardiomegaly. No pericardial effusion. Mediastinum/Nodes: No mediastinal hematoma.  No lymphadenopathy. Lungs/Pleura: Small left pneumothorax, most apparent at the medial left cardiophrenic angle on series 4, image 117. trace left pleural effusion or hemothorax. There is left lung atelectasis but no pulmonary contusion. The major airways are patent. Mild dependent right lung atelectasis, but no right pneumothorax or pleural effusion. Musculoskeletal: There are nondisplaced fractures of the posterior left 5th, 6th, 7th posterior ribs. There are minimally to nondisplaced fractures of the left costovertebral junctions at the 9th, 10th levels. There are associated nondisplaced fractures through the left 6th and 7th thoracic vertebral transverse processes. There is a comminuted and mildly displaced fracture of the left lateral seventh rib, with associated small volume adjacent pleural hemorrhaging gas (series 4, image 91). There is a minimally displaced fracture of the left lateral 8th rib. There is probably a nondisplaced fracture of the left lateral 6th rib. No thoracic vertebral body or other posterior element fracture. No acute right rib fracture. No sternal fracture. Visible shoulder structures appear intact. CT ABDOMEN PELVIS FINDINGS Hepatobiliary: The liver appears intact with a small 16 millimeter hypodense area in the posterior right dome with peripheral nodular enhancement most compatible with a benign hepatic hemangioma. Normal liver enhancement elsewhere. No perihepatic fluid. Negative gallbladder. Pancreas: Negative. Spleen: Intact, negative. Adrenals/Urinary Tract: Normal adrenal glands. Bilateral renal enhancement and contrast excretion is symmetric and normal. The left renal collecting system and ureter are duplicated (normal variant). Diminutive and unremarkable urinary bladder. Occasional pelvic phleboliths. Stomach/Bowel: Negative rectum aside from  retained stool. There is moderate to severe sigmoid colon diverticulosis, as well as retained stool, but no active inflammation. Mild diverticulosis in the left colon and at the splenic flexure without active inflammation. Mildly redundant transverse colon. There is mild diverticulosis of the right colon without active inflammation. Normal cecum and appendix. Negative terminal ileum. No dilated or abnormal small bowel loops. Largely decompressed and negative stomach and duodenum. No abdominal free air, or free fluid. Vascular/Lymphatic: The abdominal aorta and other major arterial structures in the abdomen and pelvis are patent and appear normal. The portal venous system is patent. No lymphadenopathy. Reproductive: A round 3.3 centimeter exophytic mass from the uterus directed posteriorly is probably a benign fibroid. Negative ovaries. Other: No pelvic free fluid. No superficial body wall injury identified. Musculoskeletal: The lumbar vertebrae appear intact. The sacrum and SI joints appear intact. No pelvis fracture identified. Proximal femurs appear intact. IMPRESSION: 1. Small left pneumothorax and trace left hemothorax. No pulmonary contusion. 2. Multiple left rib fractures: Ribs 5 through 10.  Ribs 6 and 7 are fractured in two places. 3. Incidental associated nondisplaced fractures through the tips of the 6th and 7th thoracic vertebral transverse processes. No other thoracic vertebral fracture. 4. No other acute traumatic injury identified in the chest, abdomen, or pelvis. 5. Probable 3.3 cm uterine fibroid directed into the posterior pelvis on the right. 6. Severe diverticulosis of the sigmoid colon, and mild large bowel diverticulosis elsewhere. Electronically Signed: By: Odessa Fleming M.D. On: 04/19/2017 15:16   Ct Abdomen Pelvis W Contrast  Addendum Date: 04/19/2017   ADDENDUM REPORT: 04/19/2017 15:25 ADDENDUM: Study discussed by telephone with PA-C Daisy Lites on 04/19/2017 at 1522 hours. Electronically Signed    By: Odessa Fleming M.D.   On: 04/19/2017 15:25   Result Date: 04/19/2017 CLINICAL DATA:  62 year old female was bucked off of a horse when it was route. Left rib fractures. EXAM: CT CHEST, ABDOMEN, AND PELVIS WITH CONTRAST TECHNIQUE: Multidetector CT imaging of the chest, abdomen and pelvis was performed following the standard protocol during bolus administration of intravenous contrast. CONTRAST:  ISOVUE-300 IOPAMIDOL (ISOVUE-300) INJECTION 61% COMPARISON:  Chest and left rib radiographs 1241 hours today. FINDINGS: CT CHEST FINDINGS Cardiovascular: Normal thoracic aorta. Normal proximal great vessels. The other major mediastinal vascular structures appear intact. No cardiomegaly. No pericardial effusion. Mediastinum/Nodes: No mediastinal hematoma.  No lymphadenopathy. Lungs/Pleura: Small left pneumothorax, most apparent at the medial left cardiophrenic angle on series 4, image 117. trace left pleural effusion or hemothorax. There is left lung atelectasis but no pulmonary contusion. The major airways are patent. Mild dependent right lung atelectasis, but no right pneumothorax or pleural effusion. Musculoskeletal: There are nondisplaced fractures of the posterior left 5th, 6th, 7th posterior ribs. There are minimally to nondisplaced fractures of the left costovertebral junctions at the 9th, 10th levels. There are associated nondisplaced fractures through the left 6th and 7th thoracic vertebral transverse processes. There is a comminuted and mildly displaced fracture of the left lateral seventh rib, with associated small volume adjacent pleural hemorrhaging gas (series 4, image 91). There is a minimally displaced fracture of the left lateral 8th rib. There is probably a nondisplaced fracture of the left lateral 6th rib. No thoracic vertebral body or other posterior element fracture. No acute right rib fracture. No sternal fracture. Visible shoulder structures appear intact. CT ABDOMEN PELVIS FINDINGS Hepatobiliary:  The liver appears intact with a small 16 millimeter hypodense area in the posterior right dome with peripheral nodular enhancement most compatible with a benign hepatic hemangioma. Normal liver enhancement elsewhere. No perihepatic fluid. Negative gallbladder. Pancreas: Negative. Spleen: Intact, negative. Adrenals/Urinary Tract: Normal adrenal glands. Bilateral renal enhancement and contrast excretion is symmetric and normal. The left renal collecting system and ureter are duplicated (normal variant). Diminutive and unremarkable urinary bladder. Occasional pelvic phleboliths. Stomach/Bowel: Negative rectum aside from retained stool. There is moderate to severe sigmoid colon diverticulosis, as well as retained stool, but no active inflammation. Mild diverticulosis in the left colon and at the splenic flexure without active inflammation. Mildly redundant transverse colon. There is mild diverticulosis of the right colon without active inflammation. Normal cecum and appendix. Negative terminal ileum. No dilated or abnormal small bowel loops. Largely decompressed and negative stomach and duodenum. No abdominal free air, or free fluid. Vascular/Lymphatic: The abdominal aorta and other major arterial structures in the abdomen and pelvis are patent and appear normal. The portal venous system is patent. No lymphadenopathy. Reproductive: A round 3.3 centimeter exophytic mass from the uterus directed posteriorly is probably a  benign fibroid. Negative ovaries. Other: No pelvic free fluid. No superficial body wall injury identified. Musculoskeletal: The lumbar vertebrae appear intact. The sacrum and SI joints appear intact. No pelvis fracture identified. Proximal femurs appear intact. IMPRESSION: 1. Small left pneumothorax and trace left hemothorax. No pulmonary contusion. 2. Multiple left rib fractures: Ribs 5 through 10. Ribs 6 and 7 are fractured in two places. 3. Incidental associated nondisplaced fractures through the tips  of the 6th and 7th thoracic vertebral transverse processes. No other thoracic vertebral fracture. 4. No other acute traumatic injury identified in the chest, abdomen, or pelvis. 5. Probable 3.3 cm uterine fibroid directed into the posterior pelvis on the right. 6. Severe diverticulosis of the sigmoid colon, and mild large bowel diverticulosis elsewhere. Electronically Signed: By: Odessa Fleming M.D. On: 04/19/2017 15:16    Procedures Procedures (including critical care time)  Medications Ordered in ED Medications  iopamidol (ISOVUE-300) 61 % injection (100 mLs  Contrast Given 04/19/17 1427)     Initial Impression / Assessment and Plan / ED Course  I have reviewed the triage vital signs and the nursing notes.  Pertinent labs & imaging results that were available during my care of the patient were reviewed by me and considered in my medical decision making (see chart for details).     Patient presents today with left-sided thoracic back pain and shortness of breath secondary to fall off of a horse at 9 AM today.  No head injury or loss of consciousness and she was wearing a helmet.  She is afebrile, vital signs are stable.  She is nontoxic in appearance.  Initial chest x-ray shows fractures of the left seventh, eighth, and fifth ribs and a small left apical pneumothorax.  We will obtain CT of the chest, abdomen, and pelvis for further characterization of her injuries.  CT scans show a small left pneumothorax and trace left hemothorax with no pulmonary contusion.  She has multiple rib fractures from ribs 5 through 10.  Ribs 6 and 7 are fractured in 2 places.  I spoke with radiologist Dr. Margo Aye who states that she likely has more ribs fractured in multiple places.  She also has nondisplaced fractures through the tips of the sixth and seventh thoracic vertebral transverse processes.  No other traumatic injury identified in the chest, abdomen, or pelvis.  I spoke with Dr. Janee Morn with the trauma surgery  service who agrees to assume care of patient and bring her into the hospital for further evaluation and management.Of note, patient's EKG showed possible ST segment elevation in the inferior leads, we will obtain troponin for further evaluation. Patient seen and evaluated by Dr. Erma Heritage who agrees with assessment and plan at this time.  CRITICAL CARE Performed by: Jeanie Sewer   Total critical care time: 40 minutes  Critical care time was exclusive of separately billable procedures and treating other patients.  Critical care was necessary to treat or prevent imminent or life-threatening deterioration.  Critical care was time spent personally by me on the following activities: development of treatment plan with patient and/or surrogate as well as nursing, discussions with consultants, evaluation of patient's response to treatment, examination of patient, obtaining history from patient or surrogate, ordering and performing treatments and interventions, ordering and review of laboratory studies, ordering and review of radiographic studies, pulse oximetry and re-evaluation of patient's condition.   Final Clinical Impressions(s) / ED Diagnoses   Final diagnoses:  Closed fracture of multiple ribs of left side, initial encounter  Closed fracture of transverse process of thoracic vertebra, initial encounter [facility])    ED Discharge Orders    None      Jeanie Sewer, PA-C 04/19/17 1612  Shaune Pollack, MD 04/21/17 [phone]

## 2017-04-19 NOTE — ED Notes (Signed)
PA-C at bedside 

## 2017-04-20 ENCOUNTER — Encounter (HOSPITAL_COMMUNITY): Payer: Self-pay

## 2017-04-20 ENCOUNTER — Observation Stay (HOSPITAL_COMMUNITY): Payer: BLUE CROSS/BLUE SHIELD

## 2017-04-20 ENCOUNTER — Other Ambulatory Visit: Payer: Self-pay

## 2017-04-20 LAB — CBC
HCT: 39.4 % (ref 36.0–46.0)
HEMOGLOBIN: 12.8 g/dL (ref 12.0–15.0)
MCH: 31.6 pg (ref 26.0–34.0)
MCHC: 32.5 g/dL (ref 30.0–36.0)
MCV: 97.3 fL (ref 78.0–100.0)
Platelets: 204 10*3/uL (ref 150–400)
RBC: 4.05 MIL/uL (ref 3.87–5.11)
RDW: 13.6 % (ref 11.5–15.5)
WBC: 5.9 10*3/uL (ref 4.0–10.5)

## 2017-04-20 LAB — BASIC METABOLIC PANEL
ANION GAP: 10 (ref 5–15)
BUN: 9 mg/dL (ref 6–20)
CHLORIDE: 104 mmol/L (ref 101–111)
CO2: 24 mmol/L (ref 22–32)
Calcium: 8.7 mg/dL — ABNORMAL LOW (ref 8.9–10.3)
Creatinine, Ser: 0.79 mg/dL (ref 0.44–1.00)
GFR calc Af Amer: >60 mL/min (ref >60)
GFR calc non Af Amer: >60 mL/min (ref >60)
Glucose, Bld: 93 mg/dL (ref 65–99)
POTASSIUM: 4.1 mmol/L (ref 3.5–5.1)
SODIUM: 138 mmol/L (ref 135–145)

## 2017-04-20 MED ORDER — ADULT MULTIVITAMIN W/MINERALS CH
1.0000 | ORAL_TABLET | Freq: Every day | ORAL | Status: DC
  Administered 2017-04-21 – 2017-04-23 (×3): 1 via ORAL
  Filled 2017-04-20 (×3): qty 1

## 2017-04-20 MED ORDER — MIRTAZAPINE 15 MG PO TABS
15.0000 mg | ORAL_TABLET | Freq: Every day | ORAL | Status: DC
  Administered 2017-04-20 – 2017-04-22 (×3): 15 mg via ORAL
  Filled 2017-04-20 (×3): qty 1

## 2017-04-20 MED ORDER — ZOLMITRIPTAN 2.5 MG PO TABS
5.0000 mg | ORAL_TABLET | Freq: Every day | ORAL | Status: DC | PRN
Start: 2017-04-20 — End: 2017-04-23
  Filled 2017-04-20: qty 2

## 2017-04-20 MED ORDER — ACETAMINOPHEN 500 MG PO TABS
1000.0000 mg | ORAL_TABLET | Freq: Three times a day (TID) | ORAL | Status: DC
  Administered 2017-04-20 – 2017-04-23 (×8): 1000 mg via ORAL
  Filled 2017-04-20 (×7): qty 2

## 2017-04-20 MED ORDER — ACETAMINOPHEN-CODEINE #3 300-30 MG PO TABS: 1.0000 | ORAL_TABLET | ORAL | Status: DC | PRN

## 2017-04-20 NOTE — Progress Notes (Signed)
[facility] Progress Note     Subjective: CC: pain in left ribs Patient with pain in left rib cage, mostly with movement. Breathing and pain much improved from yesterday. Tolerating diet. Did have to take sumatriptan overnight.  Objective: Vital signs in last 24 hours: Temp:  [98.1 F (36.7 C)-98.4 F (36.9 C)] 98.1 F (36.7 C) (04/05 0750) Pulse Rate:  [64-82] 64 (04/05 0750) Resp:  [7-19] 14 (04/05 0750) BP: (104-136)/(62-81) 114/70 (04/05 0750) SpO2:  [96 %-100 %] 100 % (04/05 0750) Weight:  [71.2 kg (157 lb)] 71.2 kg (157 lb) (04/04 1150)    Intake/Output from previous day: No intake/output data recorded. Intake/Output this shift: No intake/output data recorded.  PE: Gen:  Alert, NAD, pleasant Card:  Regular rate and rhythm, radial pulses 2+ BL Pulm:  Normal effort, clear to auscultation bilaterally, tender in left ribs Abd: Soft, non-tender, non-distended, bowel sounds present Skin: warm and dry, no rashes  Psych: A&Ox3   Lab Results:  Recent Labs    04/19/17 1652 04/20/17 0540  WBC 7.7 5.9  HGB 13.4 12.8  HCT 40.1 39.4  PLT 221 204   BMET Recent Labs    04/19/17 1335 04/19/17 1341 04/19/17 1652 04/20/17 0540  NA 140 142  --  138  K 4.2 4.2  --  4.1  CL 105 103  --  104  CO2 23  --   --  24  GLUCOSE 90 87  --  93  BUN 10 12  --  9  CREATININE 0.90 1.00 0.82 0.79  CALCIUM 9.3  --   --  8.7*   PT/INR No results for input(s): LABPROT, INR in the last 72 hours. CMP     Component Value Date/Time   NA 138 04/20/2017 0540   K 4.1 04/20/2017 0540   CL 104 04/20/2017 0540   CO2 24 04/20/2017 0540   GLUCOSE 93 04/20/2017 0540   BUN 9 04/20/2017 0540   CREATININE 0.79 04/20/2017 0540   CALCIUM 8.7 (L) 04/20/2017 0540   PROT 7.4 04/10/2007 1328   ALBUMIN 3.8 04/10/2007 1328   AST 39 (H) 04/10/2007 1328   ALT 62 (H) 04/10/2007 1328   ALKPHOS 35 (L) 04/10/2007 1328   BILITOT 1.1 04/10/2007 1328   GFRNONAA >60 04/20/2017 0540   GFRAA  >60 04/20/2017 0540   Lipase     Component Value Date/Time   LIPASE 64 (H) 04/19/2017 1652       Studies/Results: Dg Ribs Unilateral W/chest Left  Result Date: 04/19/2017 CLINICAL DATA:  Larey Seat off a horse today, LEFT side pain, LEFT shoulder pain, history of prior LEFT rib fractures EXAM: LEFT RIBS AND CHEST - 3+ VIEW COMPARISON:  04/10/2007 FINDINGS: Normal heart size, mediastinal contours, and pulmonary vascularity. Lungs emphysematous but clear. No acute infiltrate or pleural effusion. Small LEFT apex pneumothorax. Bones demineralized. BB placed at site of symptoms lower LEFT chest. Mildly displaced fractures identified at lateral LEFT seventh and eighth ribs. Questionable subtle contour abnormality at the lateral LEFT sixth rib without cortical disruption. Nondisplaced fracture LEFT fifth rib. IMPRESSION: Fractures of the LEFT seventh, eighth, and fifth ribs. Small LEFT apex pneumothorax. Critical Value/emergent results were called by telephone at the time of interpretation on 04/19/2017 at 1301 hrs to Dr. Erma Heritage, who verbally acknowledged these results. Electronically Signed   By: Ulyses Southward M.D.   On: 04/19/2017 13:02   Ct Chest W Contrast  Addendum Date: 04/19/2017   ADDENDUM REPORT: 04/19/2017 15:25 ADDENDUM: Study discussed by  telephone with PA-C MINA FAWZE on 04/19/2017 at 1522 hours. Electronically Signed   By: Odessa Fleming M.D.   On: 04/19/2017 15:25   Result Date: 04/19/2017 CLINICAL DATA:  62 year old female was bucked off of a horse when it was route. Left rib fractures. EXAM: CT CHEST, ABDOMEN, AND PELVIS WITH CONTRAST TECHNIQUE: Multidetector CT imaging of the chest, abdomen and pelvis was performed following the standard protocol during bolus administration of intravenous contrast. CONTRAST:  ISOVUE-300 IOPAMIDOL (ISOVUE-300) INJECTION 61% COMPARISON:  Chest and left rib radiographs 1241 hours today. FINDINGS: CT CHEST FINDINGS Cardiovascular: Normal thoracic aorta. Normal proximal  great vessels. The other major mediastinal vascular structures appear intact. No cardiomegaly. No pericardial effusion. Mediastinum/Nodes: No mediastinal hematoma.  No lymphadenopathy. Lungs/Pleura: Small left pneumothorax, most apparent at the medial left cardiophrenic angle on series 4, image 117. trace left pleural effusion or hemothorax. There is left lung atelectasis but no pulmonary contusion. The major airways are patent. Mild dependent right lung atelectasis, but no right pneumothorax or pleural effusion. Musculoskeletal: There are nondisplaced fractures of the posterior left 5th, 6th, 7th posterior ribs. There are minimally to nondisplaced fractures of the left costovertebral junctions at the 9th, 10th levels. There are associated nondisplaced fractures through the left 6th and 7th thoracic vertebral transverse processes. There is a comminuted and mildly displaced fracture of the left lateral seventh rib, with associated small volume adjacent pleural hemorrhaging gas (series 4, image 91). There is a minimally displaced fracture of the left lateral 8th rib. There is probably a nondisplaced fracture of the left lateral 6th rib. No thoracic vertebral body or other posterior element fracture. No acute right rib fracture. No sternal fracture. Visible shoulder structures appear intact. CT ABDOMEN PELVIS FINDINGS Hepatobiliary: The liver appears intact with a small 16 millimeter hypodense area in the posterior right dome with peripheral nodular enhancement most compatible with a benign hepatic hemangioma. Normal liver enhancement elsewhere. No perihepatic fluid. Negative gallbladder. Pancreas: Negative. Spleen: Intact, negative. Adrenals/Urinary Tract: Normal adrenal glands. Bilateral renal enhancement and contrast excretion is symmetric and normal. The left renal collecting system and ureter are duplicated (normal variant). Diminutive and unremarkable urinary bladder. Occasional pelvic phleboliths. Stomach/Bowel:  Negative rectum aside from retained stool. There is moderate to severe sigmoid colon diverticulosis, as well as retained stool, but no active inflammation. Mild diverticulosis in the left colon and at the splenic flexure without active inflammation. Mildly redundant transverse colon. There is mild diverticulosis of the right colon without active inflammation. Normal cecum and appendix. Negative terminal ileum. No dilated or abnormal small bowel loops. Largely decompressed and negative stomach and duodenum. No abdominal free air, or free fluid. Vascular/Lymphatic: The abdominal aorta and other major arterial structures in the abdomen and pelvis are patent and appear normal. The portal venous system is patent. No lymphadenopathy. Reproductive: A round 3.3 centimeter exophytic mass from the uterus directed posteriorly is probably a benign fibroid. Negative ovaries. Other: No pelvic free fluid. No superficial body wall injury identified. Musculoskeletal: The lumbar vertebrae appear intact. The sacrum and SI joints appear intact. No pelvis fracture identified. Proximal femurs appear intact. IMPRESSION: 1. Small left pneumothorax and trace left hemothorax. No pulmonary contusion. 2. Multiple left rib fractures: Ribs 5 through 10. Ribs 6 and 7 are fractured in two places. 3. Incidental associated nondisplaced fractures through the tips of the 6th and 7th thoracic vertebral transverse processes. No other thoracic vertebral fracture. 4. No other acute traumatic injury identified in the chest, abdomen, or pelvis. 5.  Probable 3.3 cm uterine fibroid directed into the posterior pelvis on the right. 6. Severe diverticulosis of the sigmoid colon, and mild large bowel diverticulosis elsewhere. Electronically Signed: By: Odessa Fleming M.D. On: 04/19/2017 15:16   Ct Abdomen Pelvis W Contrast  Addendum Date: 04/19/2017   ADDENDUM REPORT: 04/19/2017 15:25 ADDENDUM: Study discussed by telephone with PA-C MINA FAWZE on 04/19/2017 at 1522  hours. Electronically Signed   By: Odessa Fleming M.D.   On: 04/19/2017 15:25   Result Date: 04/19/2017 CLINICAL DATA:  62 year old female was bucked off of a horse when it was route. Left rib fractures. EXAM: CT CHEST, ABDOMEN, AND PELVIS WITH CONTRAST TECHNIQUE: Multidetector CT imaging of the chest, abdomen and pelvis was performed following the standard protocol during bolus administration of intravenous contrast. CONTRAST:  ISOVUE-300 IOPAMIDOL (ISOVUE-300) INJECTION 61% COMPARISON:  Chest and left rib radiographs 1241 hours today. FINDINGS: CT CHEST FINDINGS Cardiovascular: Normal thoracic aorta. Normal proximal great vessels. The other major mediastinal vascular structures appear intact. No cardiomegaly. No pericardial effusion. Mediastinum/Nodes: No mediastinal hematoma.  No lymphadenopathy. Lungs/Pleura: Small left pneumothorax, most apparent at the medial left cardiophrenic angle on series 4, image 117. trace left pleural effusion or hemothorax. There is left lung atelectasis but no pulmonary contusion. The major airways are patent. Mild dependent right lung atelectasis, but no right pneumothorax or pleural effusion. Musculoskeletal: There are nondisplaced fractures of the posterior left 5th, 6th, 7th posterior ribs. There are minimally to nondisplaced fractures of the left costovertebral junctions at the 9th, 10th levels. There are associated nondisplaced fractures through the left 6th and 7th thoracic vertebral transverse processes. There is a comminuted and mildly displaced fracture of the left lateral seventh rib, with associated small volume adjacent pleural hemorrhaging gas (series 4, image 91). There is a minimally displaced fracture of the left lateral 8th rib. There is probably a nondisplaced fracture of the left lateral 6th rib. No thoracic vertebral body or other posterior element fracture. No acute right rib fracture. No sternal fracture. Visible shoulder structures appear intact. CT ABDOMEN  PELVIS FINDINGS Hepatobiliary: The liver appears intact with a small 16 millimeter hypodense area in the posterior right dome with peripheral nodular enhancement most compatible with a benign hepatic hemangioma. Normal liver enhancement elsewhere. No perihepatic fluid. Negative gallbladder. Pancreas: Negative. Spleen: Intact, negative. Adrenals/Urinary Tract: Normal adrenal glands. Bilateral renal enhancement and contrast excretion is symmetric and normal. The left renal collecting system and ureter are duplicated (normal variant). Diminutive and unremarkable urinary bladder. Occasional pelvic phleboliths. Stomach/Bowel: Negative rectum aside from retained stool. There is moderate to severe sigmoid colon diverticulosis, as well as retained stool, but no active inflammation. Mild diverticulosis in the left colon and at the splenic flexure without active inflammation. Mildly redundant transverse colon. There is mild diverticulosis of the right colon without active inflammation. Normal cecum and appendix. Negative terminal ileum. No dilated or abnormal small bowel loops. Largely decompressed and negative stomach and duodenum. No abdominal free air, or free fluid. Vascular/Lymphatic: The abdominal aorta and other major arterial structures in the abdomen and pelvis are patent and appear normal. The portal venous system is patent. No lymphadenopathy. Reproductive: A round 3.3 centimeter exophytic mass from the uterus directed posteriorly is probably a benign fibroid. Negative ovaries. Other: No pelvic free fluid. No superficial body wall injury identified. Musculoskeletal: The lumbar vertebrae appear intact. The sacrum and SI joints appear intact. No pelvis fracture identified. Proximal femurs appear intact. IMPRESSION: 1. Small left pneumothorax and trace left hemothorax.  No pulmonary contusion. 2. Multiple left rib fractures: Ribs 5 through 10. Ribs 6 and 7 are fractured in two places. 3. Incidental associated  nondisplaced fractures through the tips of the 6th and 7th thoracic vertebral transverse processes. No other thoracic vertebral fracture. 4. No other acute traumatic injury identified in the chest, abdomen, or pelvis. 5. Probable 3.3 cm uterine fibroid directed into the posterior pelvis on the right. 6. Severe diverticulosis of the sigmoid colon, and mild large bowel diverticulosis elsewhere. Electronically Signed: By: Odessa Fleming M.D. On: 04/19/2017 15:16    Anti-infectives: Anti-infectives (From admission, onward)   None       Assessment/Plan Fall from horse Left rib fractures 5-10 with small pneumothorax - pulm toilet, IS, pain control - repeat CXR appears stable, read pending - PT/OT Left T6-7 TVP fractures - PT/OT, pain control Hx of migraines - zomig prn  FEN: reg diet, saline lock IV VTE: SCD, lovenox ID: no abx indicated   Dispo: PT/OT. Possibly home this afternoon vs tomorrow AM  LOS: 0 days    Wells Guiles , [facility] Surgery 04/20/2017, 9:39 AM Pager: [phone] Trauma Pager: [phone] Mon-Fri 7:00 am-4:30 pm Sat-Sun 7:00 am-11:30 am

## 2017-04-20 NOTE — Care Management Note (Signed)
Case Management Note  Patient Details  Name: Shannon EwingRobin Henderson MRN: [id number] Date of Birth: 05/02/1955  Subjective/Objective:  This 62 y.o. female admitted after being thrown by her horse.  She sustained Lt rib fx 5-10, tiny PTX, associated T TVP fxs.  PTA, pt independent, lives at home with husband.                    Action/Plan: PT/OT recommending no OP follow up.  No discharge needs identified.   Expected Discharge Date:                  Expected Discharge Plan:  Home/Self Care  In-House Referral:     Discharge planning Services  CM Consult  Post Acute Care Choice:    Choice offered to:     DME Arranged:    DME Agency:     HH Arranged:    HH Agency:     Status of Service:  Completed, signed off  If discussed at [employer] Length of Stay Meetings, dates discussed:    Additional Comments:  Glennon Macmerson, Jennefer Kopp M, RN 04/20/2017, 4:54 PM

## 2017-04-20 NOTE — Progress Notes (Signed)
PT Cancellation Note  Patient Details Name: Shannon Henderson MRN: [id number] DOB: 10-03-1955   Cancelled Treatment:    Reason Eval/Treat Not Completed: PT screened, no needs identified, will sign off.  OT evaluation met all patient's needs.  There is no need for a PT evaluation. 04/20/2017  Donnella Sham, [place] [phone]  (pager)   Shannon Henderson 04/20/2017, 4:10 PM

## 2017-04-20 NOTE — Progress Notes (Addendum)
0700 Bedside shift report. Pt sitting up in chair. Denies pain at rest, c/o pain with activity. NAD, no complaints. WCTM.   0800 Pt assessed, see flow sheet. Updated with POC. Awaiting MD to see pt. WCTM.   0930 Pt medicated, see flow sheet. PA at bedside updating pt and spouse with POC. Possible D/C today if pain controlled. Pt assisted up to restroom and returned to recliner. Fall precautions in place. WCTM.   1230 Pt medicated per MAR. Denies pain at rest, stated her pain is 4/10 with activity in back and ribs. NAD, no complaints. Pt stated MD came by and will be discharged tomorrow. VSS, WCTM.   1545 Pt up walking with OT. Per OT pt is independent. NAD, no complaints. WCTM.   1745 Pt medicated per MAR. No complaints, NAD. WCTM.   1845 Pt resting in bed. Still no BM, refuses laxative. Updated with POC and possible DC in am. WCTM.

## 2017-04-20 NOTE — Evaluation (Signed)
Occupational Therapy Evaluation Patient Details Name: Shannon Henderson MRN: [id number] DOB: 1955/09/23 Today's Date: 04/20/2017    History of Present Illness This 62 y.o. female admitted after being thrown by her horse.  She sustained Lt rib fx 5-10, tiny PTX, associated T TVP fxs.  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 All education has been completed and the patient has no further questions. Pt is mod I with ADLs.  She was able to ambulate >600' independently.  She does moves slowly and is guarded with transitional movements due to pain.   See below for any follow-up Occupational Therapy or equipment needs. OT is signing off. Thank you for this referral.      Follow Up Recommendations  No OT follow up;Supervision - Intermittent    Equipment Recommendations  None recommended by OT    Recommendations for Other Services       Precautions / Restrictions Precautions Precautions: None      Mobility Bed Mobility Overal bed mobility: Modified Independent             General bed mobility comments: Pt is guarded with movement and requires increased time   Transfers Overall transfer level: Modified independent Equipment used: None             General transfer comment: She is guarded with movement and requires increased time     Balance Overall balance assessment: No apparent balance deficits (not formally assessed)                                         ADL either performed or assessed with clinical judgement   ADL Overall ADL's : Modified independent                                       General ADL Comments: Pt donned prosthesis mod I this am.  She is able to access both feet.  No balance deficits noted.  She will sponge bathe initially upon discharge.  Did discuss adding a hand held hose to the tub nozel, but she  is not intereseted in this option      Vision Patient Visual Report: No change from baseline       Perception     Praxis      Pertinent Vitals/Pain Pain Assessment: Faces Faces Pain Scale: Hurts even more Pain Location: Lt ribs with movement  Pain Descriptors / Indicators: Grimacing;Headache Pain Intervention(s): Monitored during session     Hand Dominance Right   Extremity/Trunk Assessment Upper Extremity Assessment Upper Extremity Assessment: Overall WFL for tasks assessed(mildly limited by pain )   Lower Extremity Assessment Lower Extremity Assessment: Defer to PT evaluation   Cervical / Trunk Assessment Cervical / Trunk Assessment: Other exceptions Cervical / Trunk Exceptions: guarded with movement due to rib pain    Communication Communication Communication: No difficulties   Cognition Arousal/Alertness: Awake/alert Behavior During Therapy: WFL for tasks assessed/performed Overall Cognitive Status: Within Functional Limits for tasks assessed                                     General Comments  Exercises     Shoulder Instructions      Home Living Family/patient expects to be discharged to:: Private residence Living Arrangements: Spouse/significant other   Type of Home: House Home Access: Stairs to enter Enter[employer] CorporationEntrance Stairs-Number of Steps: 2   Home Layout: Two level;Able to live on main level with bedroom/bathroom     Bathroom Shower/Tub: Tub only   Bathroom Toilet: Standard     Home Equipment: None   Additional Comments: Pt indicates that spouse is incapable of assiting her       Prior Functioning/Environment Level of Independence: Independent        Comments: 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         OT Problem List:        OT Treatment/Interventions:      OT Goals(Current goals can be found in the care plan section) Acute Rehab OT Goals Patient Stated Goal: to feel  better and teach a class 4/14 in [place]  OT Goal Formulation: All assessment and education complete, DC therapy  OT Frequency:     Barriers to D/C:            Co-evaluation              AM-PAC PT "6 Clicks" Daily Activity     Outcome Measure Help from another person eating meals?: None Help from another person taking care of personal grooming?: None Help from another person toileting, which includes using toliet, bedpan, or urinal?: None Help from another person bathing (including washing, rinsing, drying)?: None Help from another person to put on and taking off regular upper body clothing?: None Help from another person to put on and taking off regular lower body clothing?: None 6 Click Score: 24   End of Session Nurse Communication: Mobility status  Activity Tolerance: Patient tolerated treatment well Patient left: in bed;with call bell/phone within reach;with family/visitor present  OT Visit Diagnosis: Pain                Time: [id number] OT Time Calculation (min): 40 min Charges:  OT General Charges $OT Visit: 1 Visit OT Evaluation $OT Eval Low Complexity: 1 Low OT Treatments $Therapeutic Activity: 8-22 mins G-Codes:     Reynolds AmericanWendi Laria Grimmett, OTR/L [phone]   Jeani HawkingConarpe, Chaze Hruska M 04/20/2017, 3:44 PM

## 2017-04-21 ENCOUNTER — Observation Stay (HOSPITAL_COMMUNITY): Payer: BLUE CROSS/BLUE SHIELD

## 2017-04-21 MED ORDER — OXYCODONE HCL 5 MG PO TABS: 5.0000 mg | ORAL_TABLET | Freq: Four times a day (QID) | ORAL | 0 refills | Status: AC | PRN

## 2017-04-21 MED ORDER — KETOROLAC TROMETHAMINE 10 MG PO TABS: 10.0000 mg | ORAL_TABLET | Freq: Four times a day (QID) | ORAL | 0 refills | Status: AC

## 2017-04-21 NOTE — Progress Notes (Addendum)
[facility] Progress Note     Subjective: CC- rib pain Patient admitted yesterday after falling from horse and suffering multiple left rib fractures with a small pneumothorax, no chest tube placed. States that she has significant pain initially when getting up to mobilize, but at rest and once she starts ambulating she is ok. Denies CP or SOB. O2 sats stable on RA. Chest xray not yet done this morning. Tolerating diet. Denies n/v. Feels ready to go home today. Worked with OT yesterday who recommended no follow up.  Objective: Vital signs in last 24 hours: Temp:  [97.7 F (36.5 C)-98.2 F (36.8 C)] 98 F (36.7 C) (04/06 0748) Pulse Rate:  [62-72] 68 (04/06 0748) Resp:  [12-15] 13 (04/06 0748) BP: (101-121)/(68-86) 111/72 (04/06 0748) SpO2:  [99 %-100 %] 99 % (04/06 0748) Last BM Date: 04/19/17  Intake/Output from previous day: 04/05 0701 - 04/06 0700 In: 1560 [P.O.:1560] Out: -  Intake/Output this shift: Total I/O In: 3 [I.V.:3] Out: -   PE: Gen:  Alert, NAD, pleasant HEENT: EOM's intact, pupils equal and round Card:  RRR, no M/G/R heard Pulm:  CTAB, no W/R/R, effort normal, pulling 1500 on IS Abd: Soft, NT/ND, +BS, no HSM, no hernia Psych: A&Ox3  Skin: no rashes noted, warm and dry  Lab Results:  Recent Labs    04/19/17 1652 04/20/17 0540  WBC 7.7 5.9  HGB 13.4 12.8  HCT 40.1 39.4  PLT 221 204   BMET Recent Labs    04/19/17 1335 04/19/17 1341 04/19/17 1652 04/20/17 0540  NA 140 142  --  138  K 4.2 4.2  --  4.1  CL 105 103  --  104  CO2 23  --   --  24  GLUCOSE 90 87  --  93  BUN 10 12  --  9  CREATININE 0.90 1.00 0.82 0.79  CALCIUM 9.3  --   --  8.7*   PT/INR No results for input(s): LABPROT, INR in the last 72 hours. CMP     Component Value Date/Time   NA 138 04/20/2017 0540   K 4.1 04/20/2017 0540   CL 104 04/20/2017 0540   CO2 24 04/20/2017 0540   GLUCOSE 93 04/20/2017 0540   BUN 9 04/20/2017 0540   CREATININE 0.79  04/20/2017 0540   CALCIUM 8.7 (L) 04/20/2017 0540   PROT 7.4 04/10/2007 1328   ALBUMIN 3.8 04/10/2007 1328   AST 39 (H) 04/10/2007 1328   ALT 62 (H) 04/10/2007 1328   ALKPHOS 35 (L) 04/10/2007 1328   BILITOT 1.1 04/10/2007 1328   GFRNONAA >60 04/20/2017 0540   GFRAA >60 04/20/2017 0540   Lipase     Component Value Date/Time   LIPASE 64 (H) 04/19/2017 1652       Studies/Results: Dg Ribs Unilateral W/chest Left  Result Date: 04/19/2017 CLINICAL DATA:  Larey SeatFell off a horse today, LEFT side pain, LEFT shoulder pain, history of prior LEFT rib fractures EXAM: LEFT RIBS AND CHEST - 3+ VIEW COMPARISON:  04/10/2007 FINDINGS: Normal heart size, mediastinal contours, and pulmonary vascularity. Lungs emphysematous but clear. No acute infiltrate or pleural effusion. Small LEFT apex pneumothorax. Bones demineralized. BB placed at site of symptoms lower LEFT chest. Mildly displaced fractures identified at lateral LEFT seventh and eighth ribs. Questionable subtle contour abnormality at the lateral LEFT sixth rib without cortical disruption. Nondisplaced fracture LEFT fifth rib. IMPRESSION: Fractures of the LEFT seventh, eighth, and fifth ribs. Small LEFT apex pneumothorax. Critical Value/emergent results  were called by telephone at the time of interpretation on 04/19/2017 at 1301 hrs to Dr. Erma Heritage, who verbally acknowledged these results. Electronically Signed   By: Ulyses Southward M.D.   On: 04/19/2017 13:02   Ct Chest W Contrast  Addendum Date: 04/19/2017   ADDENDUM REPORT: 04/19/2017 15:25 ADDENDUM: Study discussed by telephone with PA-C MINA FAWZE on 04/19/2017 at 1522 hours. Electronically Signed   By: Odessa Fleming M.D.   On: 04/19/2017 15:25   Result Date: 04/19/2017 CLINICAL DATA:  62 year old female was bucked off of a horse when it was route. Left rib fractures. EXAM: CT CHEST, ABDOMEN, AND PELVIS WITH CONTRAST TECHNIQUE: Multidetector CT imaging of the chest, abdomen and pelvis was performed following the  standard protocol during bolus administration of intravenous contrast. CONTRAST:  ISOVUE-300 IOPAMIDOL (ISOVUE-300) INJECTION 61% COMPARISON:  Chest and left rib radiographs 1241 hours today. FINDINGS: CT CHEST FINDINGS Cardiovascular: Normal thoracic aorta. Normal proximal great vessels. The other major mediastinal vascular structures appear intact. No cardiomegaly. No pericardial effusion. Mediastinum/Nodes: No mediastinal hematoma.  No lymphadenopathy. Lungs/Pleura: Small left pneumothorax, most apparent at the medial left cardiophrenic angle on series 4, image 117. trace left pleural effusion or hemothorax. There is left lung atelectasis but no pulmonary contusion. The major airways are patent. Mild dependent right lung atelectasis, but no right pneumothorax or pleural effusion. Musculoskeletal: There are nondisplaced fractures of the posterior left 5th, 6th, 7th posterior ribs. There are minimally to nondisplaced fractures of the left costovertebral junctions at the 9th, 10th levels. There are associated nondisplaced fractures through the left 6th and 7th thoracic vertebral transverse processes. There is a comminuted and mildly displaced fracture of the left lateral seventh rib, with associated small volume adjacent pleural hemorrhaging gas (series 4, image 91). There is a minimally displaced fracture of the left lateral 8th rib. There is probably a nondisplaced fracture of the left lateral 6th rib. No thoracic vertebral body or other posterior element fracture. No acute right rib fracture. No sternal fracture. Visible shoulder structures appear intact. CT ABDOMEN PELVIS FINDINGS Hepatobiliary: The liver appears intact with a small 16 millimeter hypodense area in the posterior right dome with peripheral nodular enhancement most compatible with a benign hepatic hemangioma. Normal liver enhancement elsewhere. No perihepatic fluid. Negative gallbladder. Pancreas: Negative. Spleen: Intact, negative.  Adrenals/Urinary Tract: Normal adrenal glands. Bilateral renal enhancement and contrast excretion is symmetric and normal. The left renal collecting system and ureter are duplicated (normal variant). Diminutive and unremarkable urinary bladder. Occasional pelvic phleboliths. Stomach/Bowel: Negative rectum aside from retained stool. There is moderate to severe sigmoid colon diverticulosis, as well as retained stool, but no active inflammation. Mild diverticulosis in the left colon and at the splenic flexure without active inflammation. Mildly redundant transverse colon. There is mild diverticulosis of the right colon without active inflammation. Normal cecum and appendix. Negative terminal ileum. No dilated or abnormal small bowel loops. Largely decompressed and negative stomach and duodenum. No abdominal free air, or free fluid. Vascular/Lymphatic: The abdominal aorta and other major arterial structures in the abdomen and pelvis are patent and appear normal. The portal venous system is patent. No lymphadenopathy. Reproductive: A round 3.3 centimeter exophytic mass from the uterus directed posteriorly is probably a benign fibroid. Negative ovaries. Other: No pelvic free fluid. No superficial body wall injury identified. Musculoskeletal: The lumbar vertebrae appear intact. The sacrum and SI joints appear intact. No pelvis fracture identified. Proximal femurs appear intact. IMPRESSION: 1. Small left pneumothorax and trace left hemothorax. No  pulmonary contusion. 2. Multiple left rib fractures: Ribs 5 through 10. Ribs 6 and 7 are fractured in two places. 3. Incidental associated nondisplaced fractures through the tips of the 6th and 7th thoracic vertebral transverse processes. No other thoracic vertebral fracture. 4. No other acute traumatic injury identified in the chest, abdomen, or pelvis. 5. Probable 3.3 cm uterine fibroid directed into the posterior pelvis on the right. 6. Severe diverticulosis of the sigmoid  colon, and mild large bowel diverticulosis elsewhere. Electronically Signed: By: Odessa Fleming M.D. On: 04/19/2017 15:16   Ct Abdomen Pelvis W Contrast  Addendum Date: 04/19/2017   ADDENDUM REPORT: 04/19/2017 15:25 ADDENDUM: Study discussed by telephone with PA-C MINA FAWZE on 04/19/2017 at 1522 hours. Electronically Signed   By: Odessa Fleming M.D.   On: 04/19/2017 15:25   Result Date: 04/19/2017 CLINICAL DATA:  62 year old female was bucked off of a horse when it was route. Left rib fractures. EXAM: CT CHEST, ABDOMEN, AND PELVIS WITH CONTRAST TECHNIQUE: Multidetector CT imaging of the chest, abdomen and pelvis was performed following the standard protocol during bolus administration of intravenous contrast. CONTRAST:  ISOVUE-300 IOPAMIDOL (ISOVUE-300) INJECTION 61% COMPARISON:  Chest and left rib radiographs 1241 hours today. FINDINGS: CT CHEST FINDINGS Cardiovascular: Normal thoracic aorta. Normal proximal great vessels. The other major mediastinal vascular structures appear intact. No cardiomegaly. No pericardial effusion. Mediastinum/Nodes: No mediastinal hematoma.  No lymphadenopathy. Lungs/Pleura: Small left pneumothorax, most apparent at the medial left cardiophrenic angle on series 4, image 117. trace left pleural effusion or hemothorax. There is left lung atelectasis but no pulmonary contusion. The major airways are patent. Mild dependent right lung atelectasis, but no right pneumothorax or pleural effusion. Musculoskeletal: There are nondisplaced fractures of the posterior left 5th, 6th, 7th posterior ribs. There are minimally to nondisplaced fractures of the left costovertebral junctions at the 9th, 10th levels. There are associated nondisplaced fractures through the left 6th and 7th thoracic vertebral transverse processes. There is a comminuted and mildly displaced fracture of the left lateral seventh rib, with associated small volume adjacent pleural hemorrhaging gas (series 4, image 91). There is a  minimally displaced fracture of the left lateral 8th rib. There is probably a nondisplaced fracture of the left lateral 6th rib. No thoracic vertebral body or other posterior element fracture. No acute right rib fracture. No sternal fracture. Visible shoulder structures appear intact. CT ABDOMEN PELVIS FINDINGS Hepatobiliary: The liver appears intact with a small 16 millimeter hypodense area in the posterior right dome with peripheral nodular enhancement most compatible with a benign hepatic hemangioma. Normal liver enhancement elsewhere. No perihepatic fluid. Negative gallbladder. Pancreas: Negative. Spleen: Intact, negative. Adrenals/Urinary Tract: Normal adrenal glands. Bilateral renal enhancement and contrast excretion is symmetric and normal. The left renal collecting system and ureter are duplicated (normal variant). Diminutive and unremarkable urinary bladder. Occasional pelvic phleboliths. Stomach/Bowel: Negative rectum aside from retained stool. There is moderate to severe sigmoid colon diverticulosis, as well as retained stool, but no active inflammation. Mild diverticulosis in the left colon and at the splenic flexure without active inflammation. Mildly redundant transverse colon. There is mild diverticulosis of the right colon without active inflammation. Normal cecum and appendix. Negative terminal ileum. No dilated or abnormal small bowel loops. Largely decompressed and negative stomach and duodenum. No abdominal free air, or free fluid. Vascular/Lymphatic: The abdominal aorta and other major arterial structures in the abdomen and pelvis are patent and appear normal. The portal venous system is patent. No lymphadenopathy. Reproductive: A round 3.3  centimeter exophytic mass from the uterus directed posteriorly is probably a benign fibroid. Negative ovaries. Other: No pelvic free fluid. No superficial body wall injury identified. Musculoskeletal: The lumbar vertebrae appear intact. The sacrum and SI  joints appear intact. No pelvis fracture identified. Proximal femurs appear intact. IMPRESSION: 1. Small left pneumothorax and trace left hemothorax. No pulmonary contusion. 2. Multiple left rib fractures: Ribs 5 through 10. Ribs 6 and 7 are fractured in two places. 3. Incidental associated nondisplaced fractures through the tips of the 6th and 7th thoracic vertebral transverse processes. No other thoracic vertebral fracture. 4. No other acute traumatic injury identified in the chest, abdomen, or pelvis. 5. Probable 3.3 cm uterine fibroid directed into the posterior pelvis on the right. 6. Severe diverticulosis of the sigmoid colon, and mild large bowel diverticulosis elsewhere. Electronically Signed: By: Odessa Fleming M.D. On: 04/19/2017 15:16   Dg Chest Port 1 View  Result Date: 04/20/2017 CLINICAL DATA:  Left rib fracture, pneumothorax EXAM: PORTABLE CHEST 1 VIEW COMPARISON:  CT chest 04/19/2017 FINDINGS: No focal consolidation. Left basilar atelectasis. Trace left apical pneumothorax measuring less than 10%. No right pneumothorax. Stable cardiomediastinal silhouette. No aggressive osseous lesion. Partially visualized nondisplaced left fifth, seventh, and eighth rib fractures. IMPRESSION: Trace left apical pneumothorax measuring less than 10%. Electronically Signed   By: Elige Ko   On: 04/20/2017 10:41    Anti-infectives: Anti-infectives (From admission, onward)   None       Assessment/Plan Fall from horse Left rib fractures 5-10 with small pneumothorax - repeat CXR this AM. - pulm toilet, IS, pain control Left T6-7 TVP fractures - PT/OT, pain control Hx of migraines - patient reports taking zomig for migraines, have ordered sumatriptan prn as a substitute  FEN: reg diet, saline lock IV VTE: SCD, lovenox ID: no abx indicated   Plan: Repeat CXR. If PNX stable patient will be ready for discharge. She has follow up information on AVS and pain rx signed at the front of the unit. Therapy  recommends no follow up.  Addendum: Repeat CXR with slight worsening of pneumothorax. Reviewed with MD, still would not require chest tube placement. Continue pulmonary toilet and repeat CXR in AM.   LOS: 0 days    Franne Forts , [facility] 04/21/2017, 11:04 AM Pager: [phone] Consults: [phone] Mon-Fri 7:00 am-4:30 pm Sat-Sun 7:00 am-11:30 am

## 2017-04-21 NOTE — Clinical Social Work Note (Signed)
Clinical Social Work Assessment  Patient Details  Name: Shannon Henderson MRN: [id number] Date of Birth: 11-01-1955  Date of referral:  04/21/17               Reason for consult:  Other (Comment Required)(SBIRT.)                Permission sought to share information with:  Family Supports Permission granted to share information::  Yes, Verbal Permission Granted, No  Name::     none given  Agency::  none given  Relationship::   none given   Contact Information:  none given   Housing/Transportation Living arrangements for the past 2 months:  Single Family Home(with significant other. ) Source of Information:  Patient Patient Interpreter Needed:  None Criminal Activity/Legal Involvement Pertinent to Current Situation/Hospitalization:  No - Comment as needed Significant Relationships:  Significant Other Lives with:  Significant Other Do you feel safe going back to the place where you live?  Yes Need for family participation in patient care:  Yes (Comment)  Care giving concerns:  CSW spoke with pt and sigficnat other at bedside. At this time pt didn't express any concers, however significant other didi express "We are fine just wish we werent in here". CSW validated feelings pt and significant others feelings about incident that led family here.    Social Worker assessment / plan:  CSW spoke with pt at bedside. During this time CSW was informed that pt is from home with significant other. Pt reports that pt has a number of supports including significant other at this time. CSW spoke with pt about the possibility of placement and pt denied needing placement services at the time of discharge. During this assessment pt was sitting up in bed speaking with CSW and company at bedside.   Employment status:  Other (Comment)(unknown at this time. ) Insurance information:  Other (Comment Required)(BCBS) PT Recommendations:    Information / Referral to community resources:  SBIRT  Patient/Family's  Response to care:  Pt's response to care was positive and agreeable to ensure that needs are met. Pt appeared optimistic about returning home and getting back to actives of daily living at this time.   Patient/Family's Understanding of and Emotional Response to Diagnosis, Current Treatment, and Prognosis:  Pt's understanding to plan of care appeared clear, however CSW did express to pt that PT/OT would work with pt to determine the need for placement at the time of discharge. Pt's emotional response to this was appropriate but still pt expressed no being interested in further placement at the time of discharge.   Emotional Assessment Appearance:  Appears older than stated age Attitude/Demeanor/Rapport:  Engaged Affect (typically observed):  Pleasant, Appropriate Orientation:  Oriented to Self, Oriented to Place, Oriented to  Time, Oriented to Situation Alcohol / Substance use:  Not Applicable Psych involvement (Current and /or in the community):  No (Comment)  Discharge Needs  Concerns to be addressed:  No discharge needs identified Readmission within the last 30 days:  No Current discharge risk:  None Barriers to Discharge:  Continued Medical Work up   [employer], [place] 04/21/2017, 9:24 AM

## 2017-04-22 ENCOUNTER — Observation Stay (HOSPITAL_COMMUNITY): Payer: BLUE CROSS/BLUE SHIELD

## 2017-04-22 NOTE — Progress Notes (Signed)
[facility] Progress Note     Subjective: CC- rib pain Patient admitted Fri after falling from horse and suffering multiple left rib fractures with a small pneumothorax, no chest tube placed. States that she has significant pain initially when getting up to mobilize, but at rest and once she starts ambulating she is ok. Denies CP or SOB. O2 sats stable on RA. Chest xray yesterday showed worsening PTx.   Tolerating diet. Denies n/v. Feels ready to go home today. Worked with OT Fri who recommended no follow up.  CXR read pending  Objective: Vital signs in last 24 hours: Temp:  [97.5 F (36.4 C)-98.3 F (36.8 C)] 97.5 F (36.4 C) (04/07 0453) Pulse Rate:  [63-65] 63 (04/07 0000) Resp:  [13-19] 13 (04/07 0000) BP: (98-114)/(66-73) 110/73 (04/07 0453) SpO2:  [96 %-99 %] 96 % (04/07 0000) Last BM Date: 04/19/17  Intake/Output from previous day: 04/06 0701 - 04/07 0700 In: 486 [P.O.:480; I.V.:6] Out: -  Intake/Output this shift: No intake/output data recorded.  PE: Gen:  Alert, NAD, pleasant HEENT: EOM's intact, pupils equal and round Card:  RRR, no M/G/R heard Pulm:  CTAB, no W/R/R, effort normal, pulling 1500 on IS Abd: Soft, NT/ND, +BS, no HSM, no hernia Psych: A&Ox3  Skin: no rashes noted, warm and dry  Lab Results:  Recent Labs    04/19/17 1652 04/20/17 0540  WBC 7.7 5.9  HGB 13.4 12.8  HCT 40.1 39.4  PLT 221 204   BMET Recent Labs    04/19/17 1335 04/19/17 1341 04/19/17 1652 04/20/17 0540  NA 140 142  --  138  K 4.2 4.2  --  4.1  CL 105 103  --  104  CO2 23  --   --  24  GLUCOSE 90 87  --  93  BUN 10 12  --  9  CREATININE 0.90 1.00 0.82 0.79  CALCIUM 9.3  --   --  8.7*   PT/INR No results for input(s): LABPROT, INR in the last 72 hours. CMP     Component Value Date/Time   NA 138 04/20/2017 0540   K 4.1 04/20/2017 0540   CL 104 04/20/2017 0540   CO2 24 04/20/2017 0540   GLUCOSE 93 04/20/2017 0540   BUN 9 04/20/2017 0540   CREATININE 0.79 04/20/2017 0540   CALCIUM 8.7 (L) 04/20/2017 0540   PROT 7.4 04/10/2007 1328   ALBUMIN 3.8 04/10/2007 1328   AST 39 (H) 04/10/2007 1328   ALT 62 (H) 04/10/2007 1328   ALKPHOS 35 (L) 04/10/2007 1328   BILITOT 1.1 04/10/2007 1328   GFRNONAA >60 04/20/2017 0540   GFRAA >60 04/20/2017 0540   Lipase     Component Value Date/Time   LIPASE 64 (H) 04/19/2017 1652       Studies/Results: Dg Chest Port 1 View  Result Date: 04/21/2017 CLINICAL DATA:  Pneumothorax on left. EXAM: PORTABLE CHEST 1 VIEW COMPARISON:  April 19, 2017 and April 20, 2017 FINDINGS: The left apical pneumothorax persists measuring 10 mm at the apex today versus is 6 mm on April 19, 2017, a little larger in the interval. A lateral component is also mildly more prominent. No right-sided pneumothorax. Atelectasis in the left base remains. The cardiomediastinal silhouette is stable. Left-sided rib fractures again noted. IMPRESSION: 1. The small left pneumothorax is a little larger in the interval measuring 10 mm at the apex today versus 6 mm April 19, 2017 and 7 mm yesterday. Known left rib fractures. Atelectasis in  the left base. These results will be called to the ordering clinician or representative by the Radiologist Assistant, and communication documented in the PACS or zVision Dashboard. Electronically Signed   By: Gerome Samavid  Williams III M.D   On: 04/21/2017 11:59    Anti-infectives: Anti-infectives (From admission, onward)   None       Assessment/Plan Fall from horse Left rib fractures 5-10 with small pneumothorax - repeat CXR this AM. - pulm toilet, IS, pain control Left T6-7 TVP fractures - PT/OT, pain control Hx of migraines - patient reports taking zomig for migraines, have ordered sumatriptan prn as a substitute  FEN: reg diet, saline lock IV VTE: SCD, lovenox ID: no abx indicated   Plan: F/U CXR. If PNX stable patient will be ready for discharge. She has follow up information on AVS and pain rx  signed at the front of the unit. Therapy recommends no follow up.  Shannon PandaAlicia C Belenda Alviar, MD  Colorectal and General Surgery [facility]

## 2017-04-22 NOTE — Discharge Instructions (Signed)

## 2017-04-23 ENCOUNTER — Encounter (HOSPITAL_COMMUNITY): Payer: Self-pay

## 2017-04-23 NOTE — Discharge Summary (Signed)
Physician Discharge Summary  Patient ID: Shannon EwingRobin Swingler MRN: [id number] DOB/AGE: 61/04/1955 61 y.o.  Admit date: 04/19/2017 Discharge date: 04/23/2017  Discharge Diagnoses Fall from a height Left rib fractures with pneumothorax Left T6-7 transverse process fractures  Consultants None  Procedures None  HPI: Patient was riding her horse when a squirrel came by and the horse took off and she fell from the horse. Fell on her left side, and immediately felt like she'd had the wind knocked out of her. She complained of pain in her left chest and left low back. Patient reported shortness of breath had improved at time of exam, oxygen saturation 100% on room air. 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 Takes no daily medications, denied allergies to any medications. Denied alcohol, tobacco or illicit drug use. 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 Patient was admitted to the trauma service.   Hospital Course: Follow up CXR 4/5 was stable. Patient worked with therapies who did not recommend any follow up therapy. CXR on 4/6 showed slightly enlarged pneumothorax so patient was kept and another CXR done 4/7 which was stable. On 04/23/17 the patient was tolerating a diet, voiding appropriately, VSS, pain well controlled and felt stable for discharge home. Patient discharged in stable condition. She will follow up as below and knows to call with any questions or concerns.   Physical Exam: Gen:  Alert, NAD, pleasant Card:  Regular rate and rhythm, radial pulses 2+ BL Pulm:  Normal effort, clear to auscultation bilaterally, tender in left ribs Abd: Soft, non-tender, non-distended, bowel sounds present Skin: warm and dry, no rashes  Psych: A&Ox3   I have personally looked this patient up in the [place] Controlled Substance Database and reviewed their medications.  Allergies as  of 04/23/2017   No Known Allergies     Medication List    STOP taking these medications   acetaminophen-codeine 300-30 MG tablet Commonly known as:  TYLENOL #3     TAKE these medications   b complex vitamins tablet Take 1 tablet by mouth daily.   CALCIUM PO Take 250 mg by mouth 2 (two) times daily.   COROMEGA OMEGA 3 SQUEEZE Emul Take 1 capsule by mouth daily.   ketorolac 10 MG tablet Commonly known as:  TORADOL Take 1 tablet (10 mg total) by mouth every 6 (six) hours for 4 days.   magnesium (amino acid chelate) 133 MG tablet Take 1 tablet by mouth 3 (three) times daily.   mirtazapine 15 MG tablet Commonly known as:  REMERON Take 15 mg by mouth at bedtime.   multivitamin capsule Take 1 capsule by mouth daily.   oxyCODONE 5 MG immediate release tablet Commonly known as:  Oxy IR/ROXICODONE Take 1 tablet (5 mg total) by mouth every 6 (six) hours as needed for severe pain.   zolmitriptan 5 MG disintegrating tablet Commonly known as:  ZOMIG-ZMT Take 5 mg by mouth daily as needed. Earliest sign of migraine        Follow-up Information    [facility]. Go on 05/08/2017.   Why:  Your appointment is at 9:00 AM. Please arrive 30 min prior to appointment time. Bring photo ID and insurance information.  Contact information: [address] [place] [phone]       [facility] Go on 05/07/2017.  Why:  Go to either location for repeat CXR. If they have any issue locating the order, have call [facility] office. Contact information: [phone] [address] [place], [place] [address]  [address], [address] [place], [place] [address]          Signed: Wells Guiles , [facility] 04/23/2017, 7:26 AM Trauma: [phone] Mon-Fri 7:00 am-4:30 pm Sat-Sun 7:00 am-11:30 am

## 2017-05-07 ENCOUNTER — Ambulatory Visit
Admission: RE | Admit: 2017-05-07 | Discharge: 2017-05-07 | Disposition: A | Discharge status: Home / Self Care | Payer: BLUE CROSS/BLUE SHIELD | Source: Ambulatory Visit | Attending: Physician Assistant | Admitting: Physician Assistant

## 2017-05-07 ENCOUNTER — Other Ambulatory Visit: Payer: Self-pay | Admitting: Physician Assistant

## 2017-05-07 DIAGNOSIS — J939 Pneumothorax, unspecified: Secondary | ICD-10-CM

## 2017-07-20 ENCOUNTER — Other Ambulatory Visit: Payer: Self-pay | Admitting: Family Medicine

## 2017-07-20 DIAGNOSIS — Z1231 Encounter for screening mammogram for malignant neoplasm of breast: Secondary | ICD-10-CM

## 2017-09-06 ENCOUNTER — Ambulatory Visit
Admission: RE | Admit: 2017-09-06 | Discharge: 2017-09-06 | Disposition: A | Discharge status: Home / Self Care | Payer: BLUE CROSS/BLUE SHIELD | Source: Ambulatory Visit | Attending: Family Medicine | Admitting: Family Medicine

## 2017-09-06 DIAGNOSIS — Z1231 Encounter for screening mammogram for malignant neoplasm of breast: Secondary | ICD-10-CM

## 2017-11-26 NOTE — Progress Notes (Signed)
Ms. Simoneau received her flu shot to her LT deltoid today at the [facility] by Gean Maidens, RN [phone] NDC:[id number] YNW:[id number] Exp:07/16/18

## 2018-07-24 ENCOUNTER — Other Ambulatory Visit: Payer: Self-pay | Admitting: Family Medicine

## 2018-07-24 DIAGNOSIS — M8580 Other specified disorders of bone density and structure, unspecified site: Secondary | ICD-10-CM

## 2018-10-02 ENCOUNTER — Ambulatory Visit
Admission: RE | Admit: 2018-10-02 | Discharge: 2018-10-02 | Disposition: A | Discharge status: Home / Self Care | Payer: BC Managed Care – PPO | Source: Ambulatory Visit | Attending: Family Medicine | Admitting: Family Medicine

## 2018-10-02 ENCOUNTER — Other Ambulatory Visit: Payer: Self-pay | Admitting: Family Medicine

## 2018-10-02 ENCOUNTER — Other Ambulatory Visit: Payer: Self-pay

## 2018-10-02 DIAGNOSIS — Z1231 Encounter for screening mammogram for malignant neoplasm of breast: Secondary | ICD-10-CM

## 2018-10-02 DIAGNOSIS — M8580 Other specified disorders of bone density and structure, unspecified site: Secondary | ICD-10-CM

## 2019-04-27 IMAGING — MG DIGITAL SCREENING BILATERAL MAMMOGRAM WITH CAD
4 series · 4 of 4 positions shown · non-contrast
Comparison: Previous exam(s).

CLINICAL DATA: Screening.

EXAM:
DIGITAL SCREENING BILATERAL MAMMOGRAM WITH CAD

[L MLO]
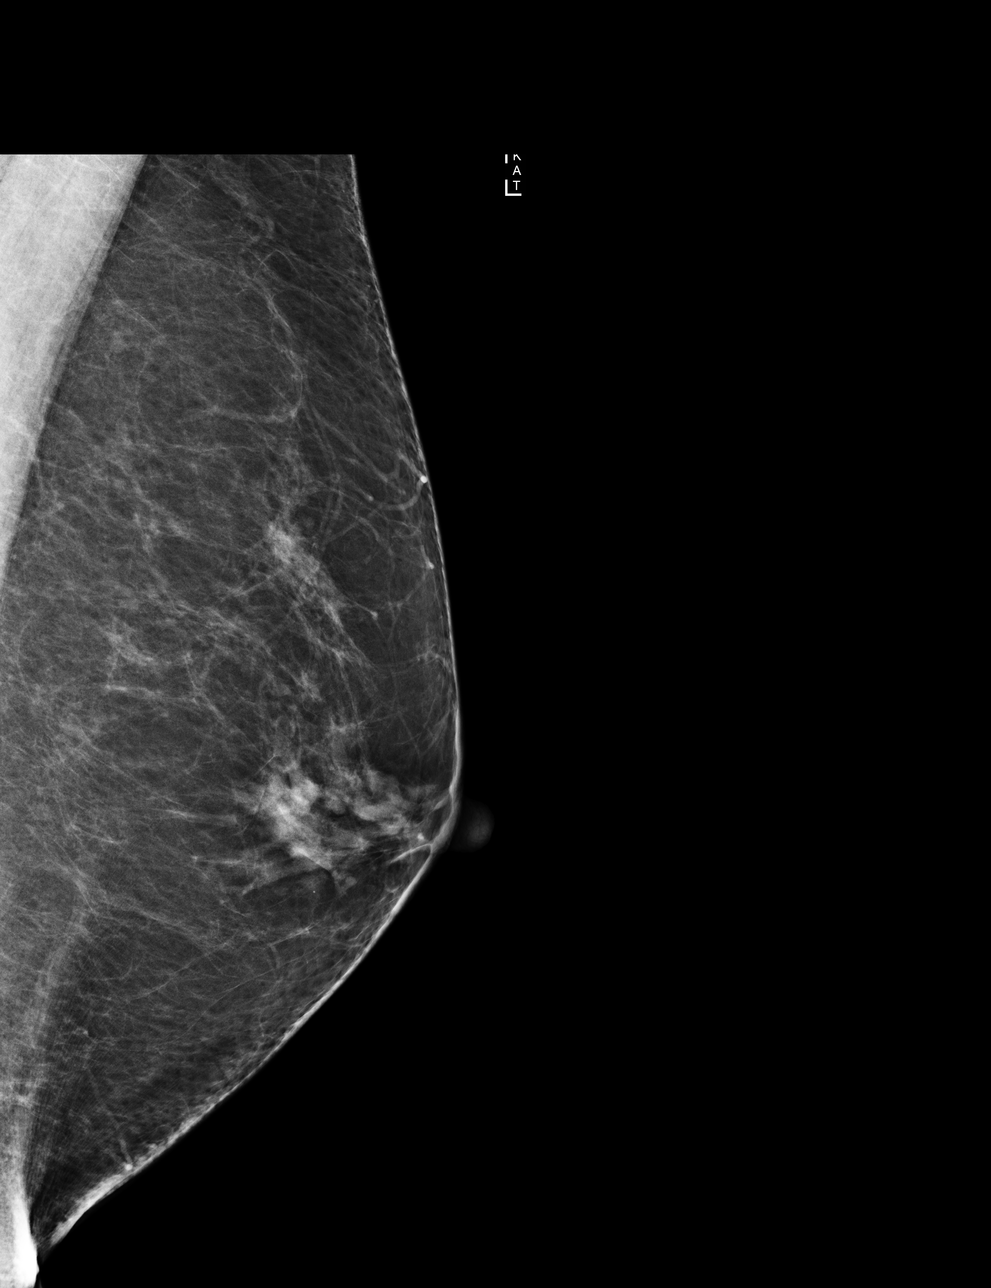

[R MLO]
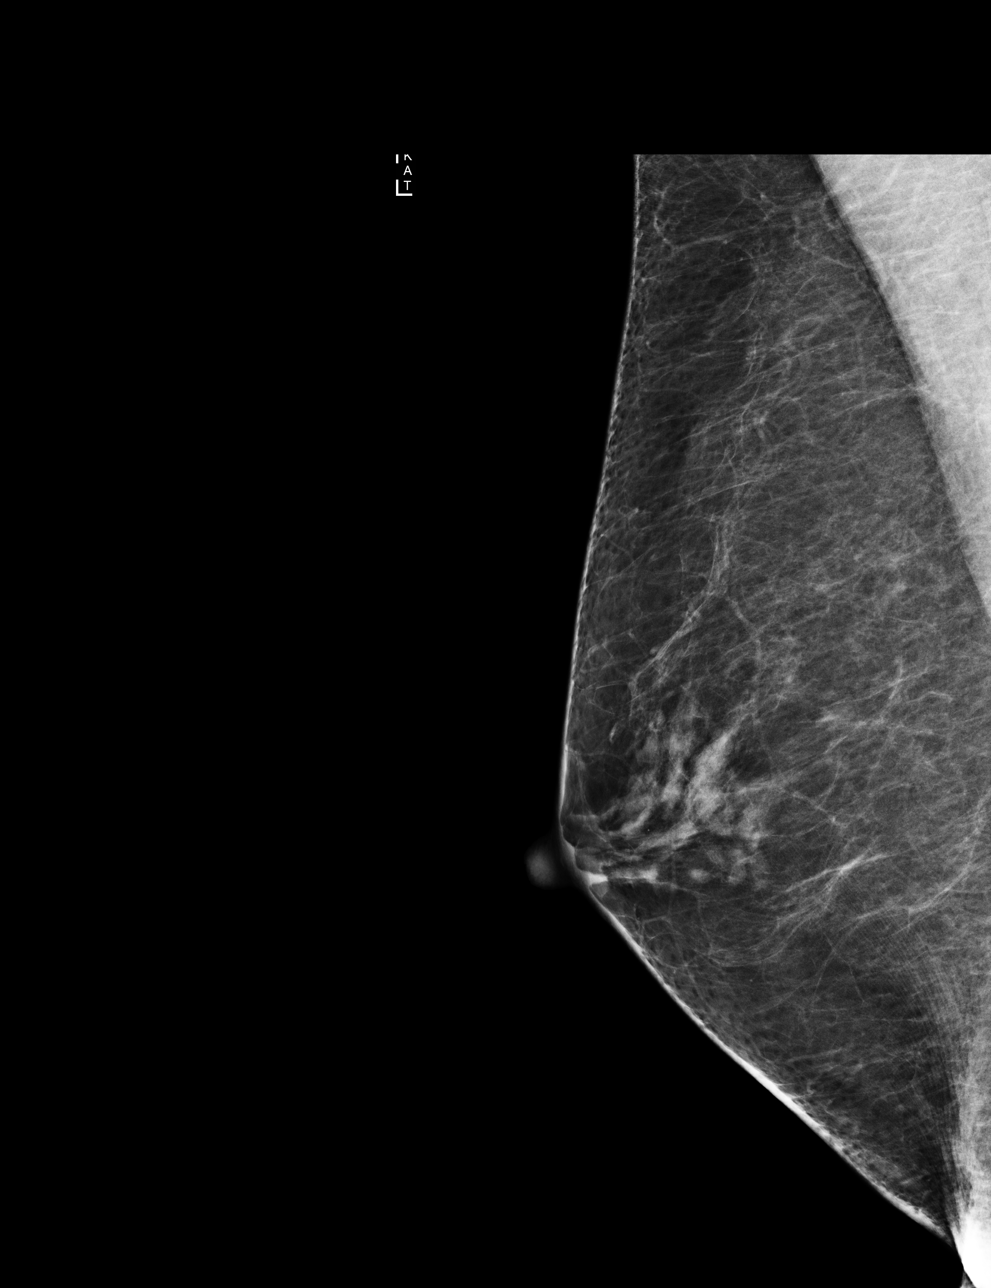

[R CC]
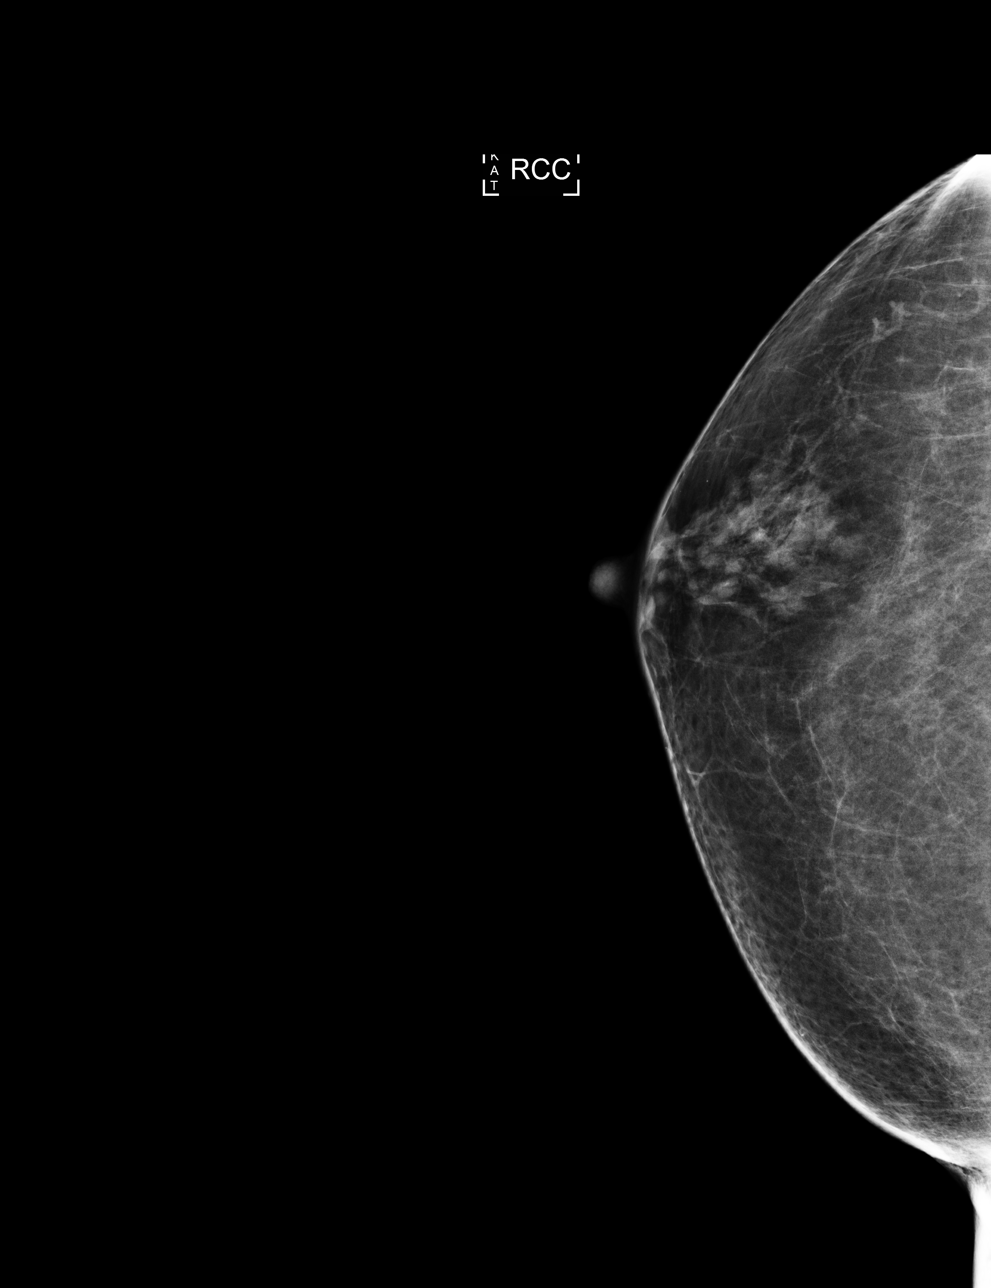

[L CC]
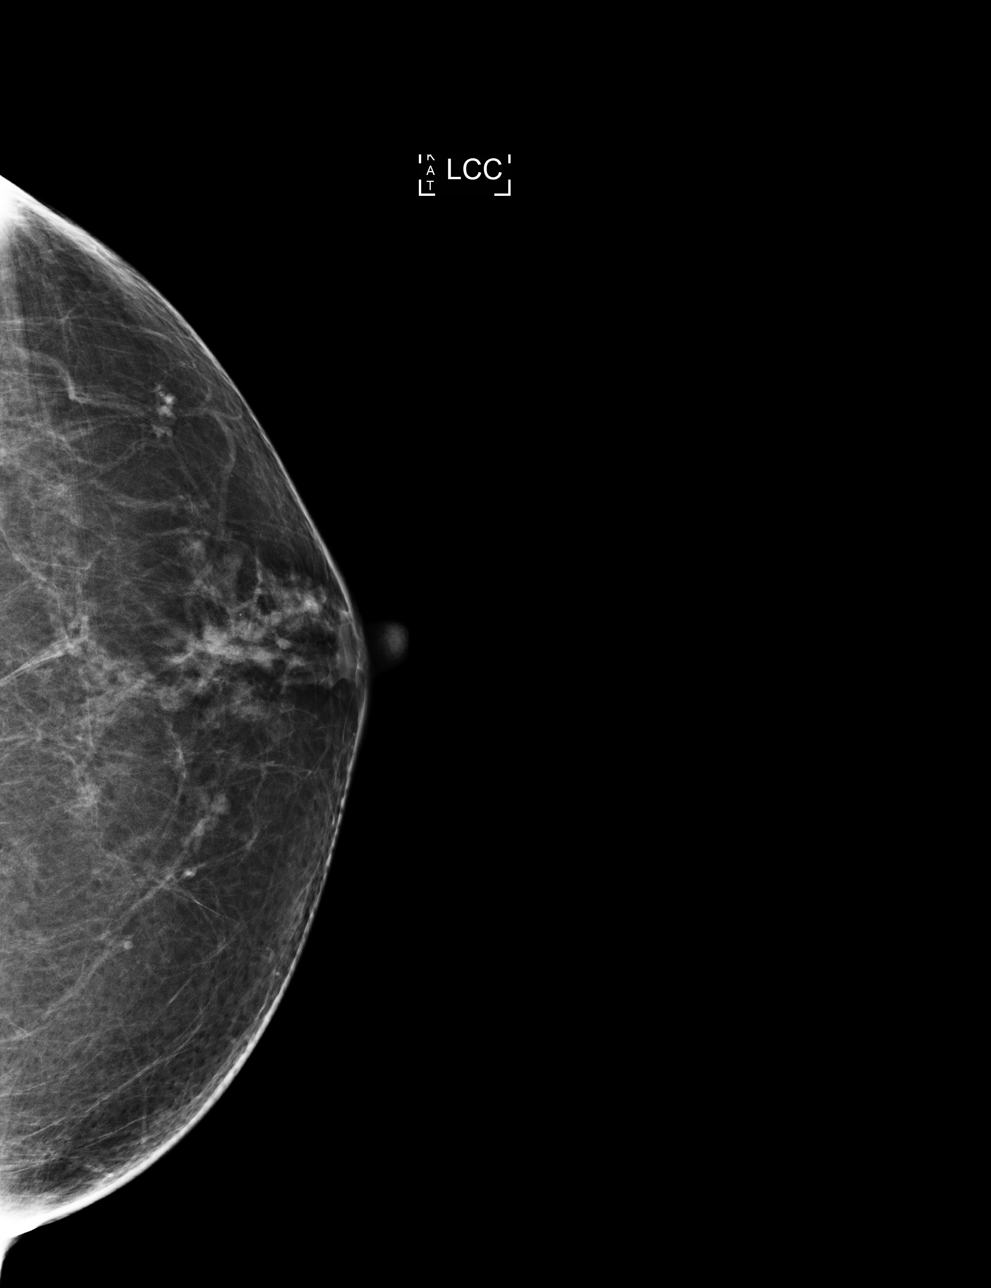

[4 of 4 positions shown; findings below may reference images not displayed]

ACR Breast Density Category b: There are scattered areas of
fibroglandular density.
FINDINGS: There are no findings suspicious for malignancy. Images were
processed with CAD.
IMPRESSION: No mammographic evidence of malignancy. A result letter of this
screening mammogram will be mailed directly to the patient.

RECOMMENDATION:
Screening mammogram in one year. (Code:AS-G-LCT)

BI-RADS CATEGORY  1: Negative.

## 2019-10-13 ENCOUNTER — Other Ambulatory Visit: Payer: Self-pay | Admitting: Family Medicine

## 2019-10-13 DIAGNOSIS — Z0000 Encounter for general adult medical examination without abnormal findings: Secondary | ICD-10-CM

## 2019-11-04 ENCOUNTER — Ambulatory Visit
Admission: RE | Admit: 2019-11-04 | Discharge: 2019-11-04 | Disposition: A | Discharge status: Home / Self Care | Payer: BC Managed Care – PPO | Source: Ambulatory Visit | Attending: Family Medicine | Admitting: Family Medicine

## 2019-11-04 ENCOUNTER — Other Ambulatory Visit: Payer: Self-pay

## 2019-11-04 DIAGNOSIS — Z0000 Encounter for general adult medical examination without abnormal findings: Secondary | ICD-10-CM

## 2020-11-25 ENCOUNTER — Other Ambulatory Visit: Payer: Self-pay | Admitting: Family Medicine

## 2020-11-25 DIAGNOSIS — Z1231 Encounter for screening mammogram for malignant neoplasm of breast: Secondary | ICD-10-CM

## 2020-11-30 ENCOUNTER — Ambulatory Visit
Admission: RE | Admit: 2020-11-30 | Discharge: 2020-11-30 | Disposition: A | Discharge status: Home / Self Care | Payer: BC Managed Care – PPO | Source: Ambulatory Visit | Attending: Family Medicine | Admitting: Family Medicine

## 2020-11-30 ENCOUNTER — Other Ambulatory Visit: Payer: Self-pay

## 2020-11-30 DIAGNOSIS — Z1231 Encounter for screening mammogram for malignant neoplasm of breast: Secondary | ICD-10-CM

## 2021-10-11 ENCOUNTER — Other Ambulatory Visit: Payer: Self-pay | Admitting: Family Medicine

## 2021-10-11 DIAGNOSIS — Z1231 Encounter for screening mammogram for malignant neoplasm of breast: Secondary | ICD-10-CM

## 2021-11-28 ENCOUNTER — Other Ambulatory Visit: Payer: Self-pay | Admitting: Family Medicine

## 2021-11-28 DIAGNOSIS — Z1231 Encounter for screening mammogram for malignant neoplasm of breast: Secondary | ICD-10-CM

## 2021-12-01 ENCOUNTER — Ambulatory Visit: Payer: BC Managed Care – PPO

## 2021-12-07 ENCOUNTER — Ambulatory Visit
Admission: RE | Admit: 2021-12-07 | Discharge: 2021-12-07 | Disposition: A | Discharge status: Home / Self Care | Payer: BC Managed Care – PPO | Source: Ambulatory Visit

## 2021-12-07 DIAGNOSIS — Z1231 Encounter for screening mammogram for malignant neoplasm of breast: Secondary | ICD-10-CM

## 2022-07-08 IMAGING — MG DIGITAL SCREENING BILAT W/ CAD
4 series · 4 of 4 positions shown · non-contrast
Comparison: Previous exam(s).

CLINICAL DATA: Screening.

EXAM:
DIGITAL SCREENING BILATERAL MAMMOGRAM WITH CAD

[R CC]
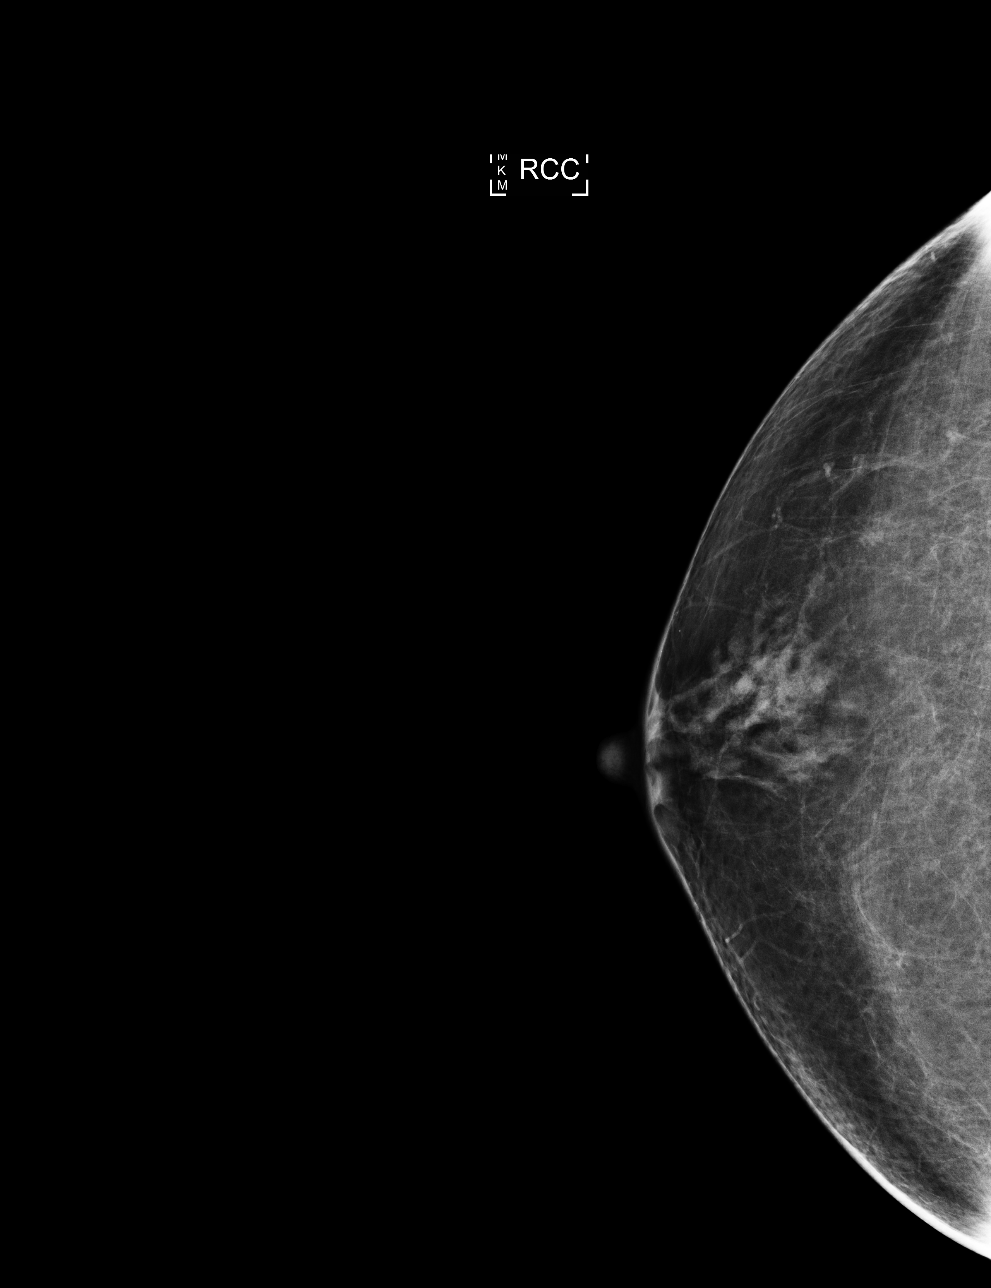

[L MLO]
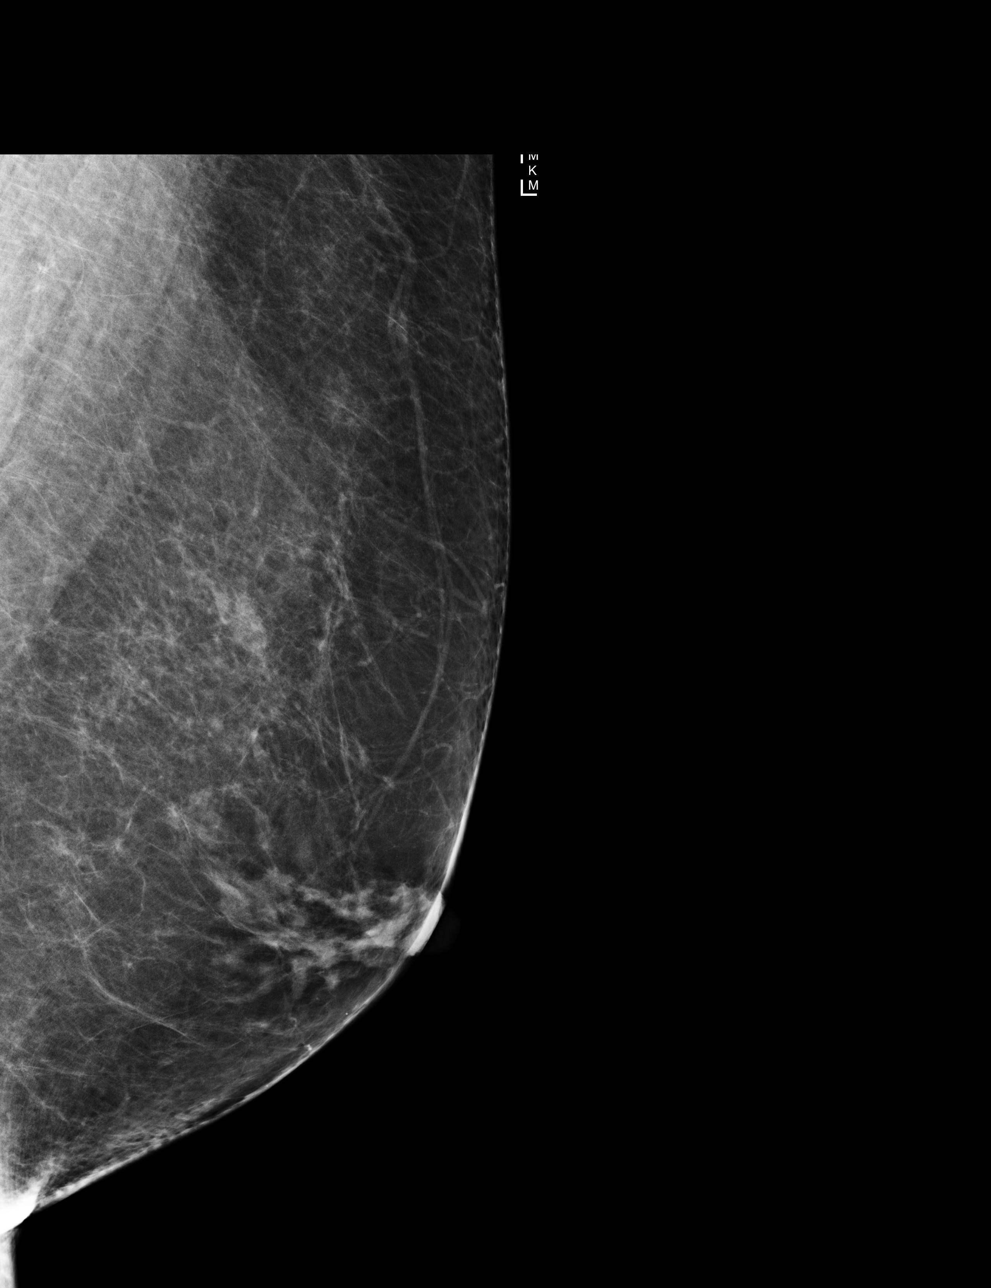

[L CC]
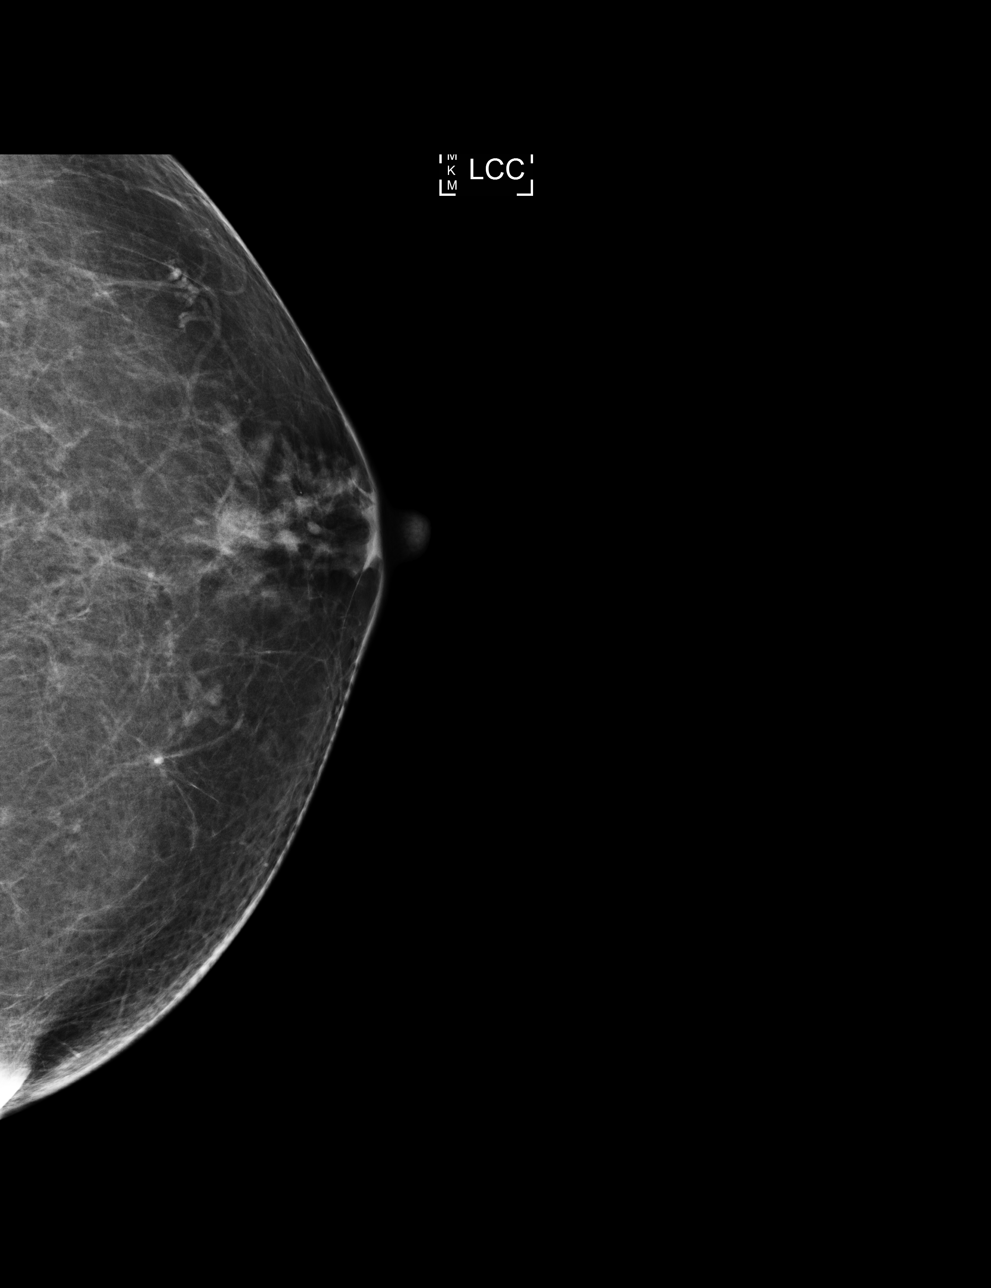

[R MLO]
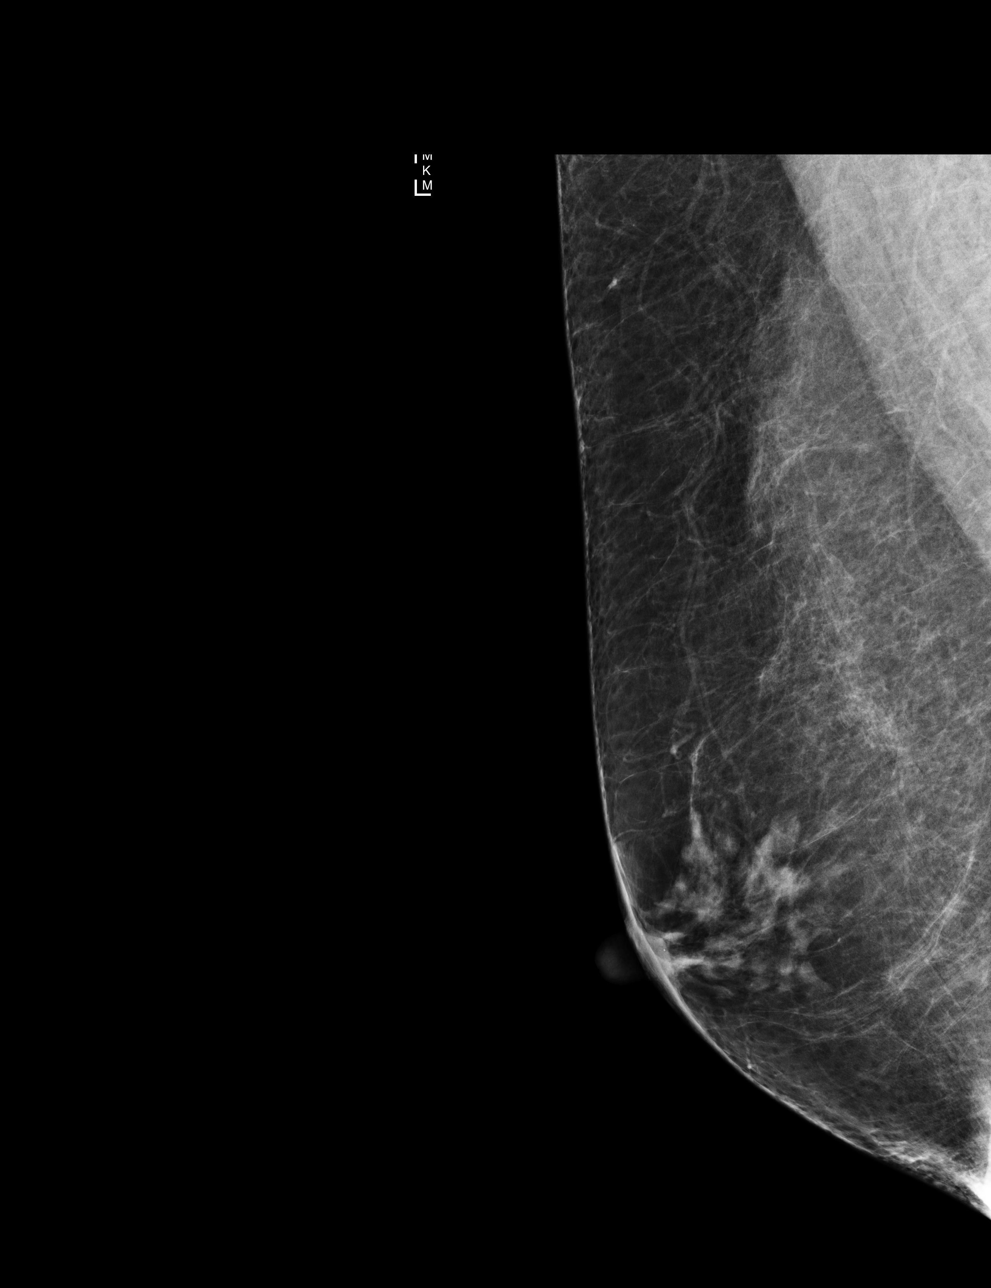

[4 of 4 positions shown; findings below may reference images not displayed]

ACR Breast Density Category b: There are scattered areas of
fibroglandular density.
FINDINGS: There are no findings suspicious for malignancy. Images were
processed with CAD.
IMPRESSION: No mammographic evidence of malignancy. A result letter of this
screening mammogram will be mailed directly to the patient.

RECOMMENDATION:
Screening mammogram in one year. (Code:AS-G-LCT)

BI-RADS CATEGORY  1: Negative.

## 2022-11-14 ENCOUNTER — Other Ambulatory Visit: Payer: Self-pay | Admitting: Family Medicine

## 2022-11-14 DIAGNOSIS — Z1231 Encounter for screening mammogram for malignant neoplasm of breast: Secondary | ICD-10-CM

## 2022-12-20 ENCOUNTER — Ambulatory Visit
Admission: RE | Admit: 2022-12-20 | Discharge: 2022-12-20 | Disposition: A | Discharge status: Home / Self Care | Payer: Medicare Other | Source: Ambulatory Visit | Attending: Family Medicine | Admitting: Family Medicine

## 2022-12-20 DIAGNOSIS — Z1231 Encounter for screening mammogram for malignant neoplasm of breast: Secondary | ICD-10-CM

## 2023-04-17 ENCOUNTER — Emergency Department (HOSPITAL_COMMUNITY)

## 2023-04-17 ENCOUNTER — Observation Stay (HOSPITAL_COMMUNITY)

## 2023-04-17 ENCOUNTER — Encounter (HOSPITAL_COMMUNITY): Payer: Self-pay | Admitting: Emergency Medicine

## 2023-04-17 ENCOUNTER — Other Ambulatory Visit: Payer: Self-pay

## 2023-04-17 ENCOUNTER — Inpatient Hospital Stay (HOSPITAL_COMMUNITY)
Admission: EM | Admit: 2023-04-17 | Discharge: 2023-04-24 | DRG: 175 | Disposition: A | Discharge status: Home / Self Care | Attending: Internal Medicine | Admitting: Internal Medicine

## 2023-04-17 ENCOUNTER — Observation Stay (HOSPITAL_BASED_OUTPATIENT_CLINIC_OR_DEPARTMENT_OTHER)

## 2023-04-17 DIAGNOSIS — Z803 Family history of malignant neoplasm of breast: Secondary | ICD-10-CM

## 2023-04-17 DIAGNOSIS — R053 Chronic cough: Secondary | ICD-10-CM

## 2023-04-17 DIAGNOSIS — I2693 Single subsegmental pulmonary embolism without acute cor pulmonale: Principal | ICD-10-CM | POA: Diagnosis present

## 2023-04-17 DIAGNOSIS — J9601 Acute respiratory failure with hypoxia: Secondary | ICD-10-CM | POA: Diagnosis not present

## 2023-04-17 DIAGNOSIS — I2699 Other pulmonary embolism without acute cor pulmonale: Secondary | ICD-10-CM | POA: Diagnosis present

## 2023-04-17 DIAGNOSIS — G4733 Obstructive sleep apnea (adult) (pediatric): Secondary | ICD-10-CM | POA: Diagnosis present

## 2023-04-17 DIAGNOSIS — Z79899 Other long term (current) drug therapy: Secondary | ICD-10-CM | POA: Diagnosis not present

## 2023-04-17 DIAGNOSIS — I952 Hypotension due to drugs: Secondary | ICD-10-CM | POA: Diagnosis not present

## 2023-04-17 DIAGNOSIS — I2694 Multiple subsegmental pulmonary emboli without acute cor pulmonale: Secondary | ICD-10-CM

## 2023-04-17 DIAGNOSIS — Z89511 Acquired absence of right leg below knee: Secondary | ICD-10-CM | POA: Diagnosis not present

## 2023-04-17 DIAGNOSIS — R918 Other nonspecific abnormal finding of lung field: Secondary | ICD-10-CM | POA: Diagnosis not present

## 2023-04-17 DIAGNOSIS — T40605A Adverse effect of unspecified narcotics, initial encounter: Secondary | ICD-10-CM | POA: Diagnosis not present

## 2023-04-17 DIAGNOSIS — Z86711 Personal history of pulmonary embolism: Secondary | ICD-10-CM | POA: Diagnosis not present

## 2023-04-17 DIAGNOSIS — G894 Chronic pain syndrome: Secondary | ICD-10-CM | POA: Diagnosis present

## 2023-04-17 DIAGNOSIS — K219 Gastro-esophageal reflux disease without esophagitis: Secondary | ICD-10-CM | POA: Diagnosis present

## 2023-04-17 LAB — CBC WITH DIFFERENTIAL/PLATELET
Abs Immature Granulocytes: 0.12 10*3/uL — ABNORMAL HIGH (ref 0.00–0.07)
Basophils Absolute: 0.1 10*3/uL (ref 0.0–0.1)
Basophils Relative: 1 %
Eosinophils Absolute: 0.1 10*3/uL (ref 0.0–0.5)
Eosinophils Relative: 1 %
HCT: 40.2 % (ref 36.0–46.0)
Hemoglobin: 12.6 g/dL (ref 12.0–15.0)
Immature Granulocytes: 1 %
Lymphocytes Relative: 12 %
Lymphs Abs: 1.2 10*3/uL (ref 0.7–4.0)
MCH: 30.5 pg (ref 26.0–34.0)
MCHC: 31.3 g/dL (ref 30.0–36.0)
MCV: 97.3 fL (ref 80.0–100.0)
Monocytes Absolute: 1.4 10*3/uL — ABNORMAL HIGH (ref 0.1–1.0)
Monocytes Relative: 14 %
Neutro Abs: 6.9 10*3/uL (ref 1.7–7.7)
Neutrophils Relative %: 71 %
Platelets: 241 10*3/uL (ref 150–400)
RBC: 4.13 MIL/uL (ref 3.87–5.11)
RDW: 13.3 % (ref 11.5–15.5)
WBC: 9.7 10*3/uL (ref 4.0–10.5)
nRBC: 0 % (ref 0.0–0.2)

## 2023-04-17 LAB — ECHOCARDIOGRAM COMPLETE
AR max vel: 2.42 cm2
AV Area VTI: 2.7 cm2
AV Area mean vel: 2.68 cm2
AV Mean grad: 5 mmHg
AV Peak grad: 8.6 mmHg
Ao pk vel: 1.47 m/s
Area-P 1/2: 3.19 cm2
Calc EF: 73.5 %
Height: 73 in
MV VTI: 2.66 cm2
S' Lateral: 2.5 cm
Single Plane A2C EF: 73.4 %
Single Plane A4C EF: 72.9 %
Weight: 2768 [oz_av]

## 2023-04-17 LAB — TROPONIN I (HIGH SENSITIVITY)
Troponin I (High Sensitivity): 6 ng/L (ref <18)
Troponin I (High Sensitivity): 8 ng/L (ref <18)

## 2023-04-17 LAB — COMPREHENSIVE METABOLIC PANEL WITH GFR
ALT: 35 U/L (ref 0–44)
AST: 18 U/L (ref 15–41)
Albumin: 3.2 g/dL — ABNORMAL LOW (ref 3.5–5.0)
Alkaline Phosphatase: 21 U/L — ABNORMAL LOW (ref 38–126)
Anion gap: 8 (ref 5–15)
BUN: 19 mg/dL (ref 8–23)
CO2: 26 mmol/L (ref 22–32)
Calcium: 8.9 mg/dL (ref 8.9–10.3)
Chloride: 102 mmol/L (ref 98–111)
Creatinine, Ser: 0.64 mg/dL (ref 0.44–1.00)
GFR, Estimated: >60 mL/min (ref >60)
Glucose, Bld: 110 mg/dL — ABNORMAL HIGH (ref 70–99)
Potassium: 4.1 mmol/L (ref 3.5–5.1)
Sodium: 136 mmol/L (ref 135–145)
Total Bilirubin: 2 mg/dL — ABNORMAL HIGH (ref 0.0–1.2)
Total Protein: 6.4 g/dL — ABNORMAL LOW (ref 6.5–8.1)

## 2023-04-17 LAB — HEPARIN LEVEL (UNFRACTIONATED): Heparin Unfractionated: 0.65 [IU]/mL (ref 0.30–0.70)

## 2023-04-17 LAB — BRAIN NATRIURETIC PEPTIDE: B Natriuretic Peptide: 94.8 pg/mL (ref 0.0–100.0)

## 2023-04-17 LAB — HIV ANTIBODY (ROUTINE TESTING W REFLEX): HIV Screen 4th Generation wRfx: NONREACTIVE

## 2023-04-17 MED ORDER — ONDANSETRON HCL 4 MG/2ML IJ SOLN
4.0000 mg | Freq: Once | INTRAMUSCULAR | Status: AC
  Administered 2023-04-17: 4 mg via INTRAVENOUS
  Filled 2023-04-17: qty 2

## 2023-04-17 MED ORDER — HEPARIN (PORCINE) 25000 UT/250ML-% IV SOLN
1600.0000 [IU]/h | INTRAVENOUS | Status: DC
  Administered 2023-04-17 – 2023-04-18 (×2): 1300 [IU]/h via INTRAVENOUS
  Administered 2023-04-19: 1450 [IU]/h via INTRAVENOUS
  Administered 2023-04-19: 1300 [IU]/h via INTRAVENOUS
  Administered 2023-04-21: 1600 [IU]/h via INTRAVENOUS
  Filled 2023-04-17 (×6): qty 250

## 2023-04-17 MED ORDER — HYDROMORPHONE HCL 1 MG/ML IJ SOLN
1.0000 mg | Freq: Once | INTRAMUSCULAR | Status: AC
  Administered 2023-04-17: 1 mg via INTRAVENOUS
  Filled 2023-04-17: qty 1

## 2023-04-17 MED ORDER — DIAZEPAM 2 MG PO TABS
2.0000 mg | ORAL_TABLET | Freq: Once | ORAL | Status: AC
  Administered 2023-04-17: 2 mg via ORAL
  Filled 2023-04-17: qty 1

## 2023-04-17 MED ORDER — HYDROMORPHONE HCL 1 MG/ML IJ SOLN
0.5000 mg | INTRAMUSCULAR | Status: DC | PRN
  Administered 2023-04-17 – 2023-04-19 (×9): 1 mg via INTRAVENOUS
  Filled 2023-04-17 (×10): qty 1

## 2023-04-17 MED ORDER — ALBUTEROL SULFATE (2.5 MG/3ML) 0.083% IN NEBU
2.5000 mg | INHALATION_SOLUTION | RESPIRATORY_TRACT | Status: DC | PRN
  Administered 2023-04-20: 2.5 mg via RESPIRATORY_TRACT
  Filled 2023-04-17: qty 3

## 2023-04-17 MED ORDER — ACETAMINOPHEN 500 MG PO TABS
1000.0000 mg | ORAL_TABLET | Freq: Once | ORAL | Status: AC
  Administered 2023-04-17: 1000 mg via ORAL
  Filled 2023-04-17: qty 2

## 2023-04-17 MED ORDER — ONDANSETRON HCL 4 MG/2ML IJ SOLN
4.0000 mg | Freq: Four times a day (QID) | INTRAMUSCULAR | Status: DC | PRN
Start: 2023-04-17 — End: 2023-04-24
  Administered 2023-04-17 – 2023-04-18 (×2): 4 mg via INTRAVENOUS
  Filled 2023-04-17 (×3): qty 2

## 2023-04-17 MED ORDER — ONDANSETRON HCL 4 MG PO TABS: 4.0000 mg | ORAL_TABLET | Freq: Four times a day (QID) | ORAL | Status: DC | PRN

## 2023-04-17 MED ORDER — ACETAMINOPHEN 325 MG PO TABS: 650.0000 mg | ORAL_TABLET | Freq: Four times a day (QID) | ORAL | Status: DC | PRN

## 2023-04-17 MED ORDER — HYDROMORPHONE HCL 1 MG/ML IJ SOLN
0.5000 mg | Freq: Once | INTRAMUSCULAR | Status: AC
  Administered 2023-04-17: 0.5 mg via INTRAVENOUS
  Filled 2023-04-17: qty 1

## 2023-04-17 MED ORDER — TRAZODONE HCL 50 MG PO TABS
25.0000 mg | ORAL_TABLET | Freq: Every evening | ORAL | Status: DC | PRN
  Administered 2023-04-17 – 2023-04-23 (×5): 25 mg via ORAL
  Filled 2023-04-17 (×5): qty 1

## 2023-04-17 MED ORDER — HEPARIN BOLUS VIA INFUSION
3000.0000 [IU] | Freq: Once | INTRAVENOUS | Status: AC
  Administered 2023-04-17: 3000 [IU] via INTRAVENOUS
  Filled 2023-04-17: qty 3000

## 2023-04-17 MED ORDER — ACETAMINOPHEN 650 MG RE SUPP: 650.0000 mg | Freq: Four times a day (QID) | RECTAL | Status: DC | PRN

## 2023-04-17 MED ORDER — OXYCODONE-ACETAMINOPHEN 5-325 MG PO TABS
1.0000 | ORAL_TABLET | Freq: Once | ORAL | Status: AC
  Administered 2023-04-17: 1 via ORAL
  Filled 2023-04-17: qty 1

## 2023-04-17 MED ORDER — FENTANYL CITRATE PF 50 MCG/ML IJ SOSY
50.0000 ug | PREFILLED_SYRINGE | Freq: Once | INTRAMUSCULAR | Status: AC
  Administered 2023-04-17: 50 ug via INTRAVENOUS
  Filled 2023-04-17: qty 1

## 2023-04-17 MED ORDER — OXYCODONE HCL 5 MG PO TABS
10.0000 mg | ORAL_TABLET | ORAL | Status: DC | PRN
  Administered 2023-04-17 – 2023-04-22 (×8): 10 mg via ORAL
  Filled 2023-04-17 (×8): qty 2

## 2023-04-17 MED ORDER — SODIUM CHLORIDE 0.9 % IV SOLN
12.5000 mg | Freq: Once | INTRAVENOUS | Status: AC
  Administered 2023-04-17: 12.5 mg via INTRAVENOUS
  Filled 2023-04-17: qty 12.5

## 2023-04-17 MED ORDER — IOHEXOL 350 MG/ML SOLN
75.0000 mL | Freq: Once | INTRAVENOUS | Status: AC | PRN
  Administered 2023-04-17: 75 mL via INTRAVENOUS

## 2023-04-17 NOTE — Progress Notes (Signed)
  Echocardiogram 2D Echocardiogram has been performed.  Ocie Doyne RDCS 04/17/2023, 2:48 PM

## 2023-04-17 NOTE — ED Triage Notes (Signed)
 Pt to ED from home c/o cough, back pain, and difficulty breathing d/t the pain.  States feels like pulled a rib, took codeine at home around 0200 for the cough.  States labs and EKG done yesterday, d-dimer was elevated and supposed to have CT done today.

## 2023-04-17 NOTE — ED Notes (Signed)
 Patient transported to CT

## 2023-04-17 NOTE — Progress Notes (Signed)
 PHARMACY - ANTICOAGULATION CONSULT NOTE  Pharmacy Consult for heparin Indication: pulmonary embolus  No Known Allergies  Patient Measurements: Height: 6\' 1"  (185.4 cm) Weight: 78.5 kg (173 lb) IBW/kg (Calculated) : 75.4 HEPARIN DW (KG): 78.5  Vital Signs: Temp: 98.8 F (37.1 C) (04/01 1531) Temp Source: Oral (04/01 1531) BP: 123/65 (04/01 1531) Pulse Rate: 88 (04/01 1531)  Labs: Recent Labs    04/17/23 0711 04/17/23 1043 04/17/23 1737  HGB 12.6  --   --   HCT 40.2  --   --   PLT 241  --   --   HEPARINUNFRC  --   --  0.65  CREATININE 0.64  --   --   TROPONINIHS 6 8  --     Estimated Creatinine Clearance: 81.2 mL/min (by C-G formula based on SCr of 0.64 mg/dL).   Medical History: Past Medical History:  Diagnosis Date   Amputee, below knee, right (HCC)    Fracture of multiple ribs 04/19/2017   L ribs 5-10 closed fractures   Pneumothorax on left 04/19/2017   s/p fall from horse    Medications:  No anticoagulants PTA  Assessment: 68 yo F with B PE.  Pharmacy consulted for heparin dosing.  CBC WNL, SCr WNL  Today, 04/17/23 17:37 Heparin level 0.65, therapeutic, with IV heparin infusing at 1300 units/hr No bleeding or heparin related issues per nurse  Goal of Therapy:  Heparin level 0.3-0.7 units/ml Monitor platelets by anticoagulation protocol: Yes   Plan:  Continue IV heparin at 1300 units/hr 6 hour confirmatory heparin level Monitor daily heparin level, CBC, signs/symptoms of bleeding   Thank you for allowing pharmacy to be a part of this patient's care.  Selinda Eon, PharmD, BCPS Clinical Pharmacist [place] 04/17/2023 7:28 PM

## 2023-04-17 NOTE — ED Provider Notes (Signed)
 Shannon Henderson EMERGENCY DEPARTMENT AT [facility] Provider Note   CSN: [id number] Arrival date & time: 04/17/23  4098     History  Chief Complaint  Patient presents with   Back Pain   Cough    Shannon Henderson is a 68 y.o. female, history of a traumatic pneumothorax, and traumatic rib fractures, who presents to the ED secondary to shortness of breath, and cough, this been going on for several months.  She presents to the ER most acutely, as she had some right back pain, that has gotten acutely worse.  She states that it hurts to take a deep breath, and she has been having shallow breathing.  Saw her PCP yesterday, had a negative chest x-ray, and elevated D-dimer, and was told to come to the ER.  She notes that she has had a chronic cough for the last few months, has been using codeine with minimal relief.  She states this lasts for only about 4 hours at a time.  She denies any hemoptysis, no recent travel, no hormonal use, and no recent surgeries.  She denies any chest pain, but states that right near her right rib cage/spine, is what hurts the most acutely.  The difficulty breathing is getting worse.  She also notes that her heart rate, has been elevated, for the last few months.  Home Medications Prior to Admission medications   Medication Sig Start Date End Date Taking? Authorizing Provider  b complex vitamins tablet Take 1 tablet by mouth daily.    [provider]  CALCIUM PO Take 250 mg by mouth 2 (two) times daily.    [provider]  mirtazapine (REMERON) 15 MG tablet Take 15 mg by mouth at bedtime. 03/17/17   [provider]  Multiple Vitamin (MULTIVITAMIN) capsule Take 1 capsule by mouth daily.    [provider]  Omega-3 Fatty Acids (COROMEGA OMEGA 3 SQUEEZE) EMUL Take 1 capsule by mouth daily.    [provider]  oxyCODONE (OXY IR/ROXICODONE) 5 MG immediate release tablet Take 1 tablet (5 mg total) by mouth every 6 (six) hours as  needed for severe pain. 04/21/17   Meuth, Lina Sar, PA-C  Specialty Vitamins Products (MAGNESIUM, AMINO ACID CHELATE,) 133 MG tablet Take 1 tablet by mouth 3 (three) times daily.    [provider]  zolmitriptan (ZOMIG-ZMT) 5 MG disintegrating tablet Take 5 mg by mouth daily as needed. Earliest sign of migraine 03/23/17   [provider]      Allergies    Patient has no known allergies.    Review of Systems   Review of Systems  Constitutional:  Negative for fever.  Respiratory:  Positive for cough.   Musculoskeletal:  Positive for back pain.    Physical Exam Updated Vital Signs BP (!) 127/54 (BP Location: Left Arm)   Pulse (!) 106   Temp (!) 101.7 F (38.7 C) (Oral)   Resp (!) 27   Ht 6\' 1"  (1.854 m)   Wt 78.5 kg   SpO2 97%   BMI 22.82 kg/m  Physical Exam Vitals and nursing note reviewed.  Constitutional:      General: She is not in acute distress.    Appearance: She is well-developed.  HENT:     Head: Normocephalic and atraumatic.  Eyes:     Conjunctiva/sclera: Conjunctivae normal.  Cardiovascular:     Rate and Rhythm: Regular rhythm. Tachycardia present.     Heart sounds: No murmur heard. Pulmonary:  Effort: No respiratory distress.     Breath sounds: Normal breath sounds.     Comments: hypoventilation Abdominal:     Palpations: Abdomen is soft.     Tenderness: There is no abdominal tenderness.  Musculoskeletal:        General: No swelling.     Cervical back: Neck supple.     Comments: TTP of R thoracic spine, but midline  Skin:    General: Skin is warm and dry.     Capillary Refill: Capillary refill takes less than 2 seconds.     Comments: No rash noted  Neurological:     Mental Status: She is alert.  Psychiatric:        Mood and Affect: Mood normal.     ED Results / Procedures / Treatments   Labs (all labs ordered are listed, but only abnormal results are displayed) Labs Reviewed  CBC WITH DIFFERENTIAL/PLATELET - Abnormal; Notable  for the following components:      Result Value   Monocytes Absolute 1.4 (*)    Abs Immature Granulocytes 0.12 (*)    All other components within normal limits  COMPREHENSIVE METABOLIC PANEL WITH GFR - Abnormal; Notable for the following components:   Glucose, Bld 110 (*)    Total Protein 6.4 (*)    Albumin 3.2 (*)    Alkaline Phosphatase 21 (*)    Total Bilirubin 2.0 (*)    All other components within normal limits  BRAIN NATRIURETIC PEPTIDE  TROPONIN I (HIGH SENSITIVITY)  TROPONIN I (HIGH SENSITIVITY)    EKG EKG Interpretation Date/Time:  Tuesday April 17 2023 07:45:49 EDT Ventricular Rate:  101 PR Interval:  159 QRS Duration:  69 QT Interval:  304 QTC Calculation: 394 R Axis:   53  Text Interpretation: Sinus tachycardia ST elevation, consider inferior injury no stemi. prior tracing shows diffuse ST elevation. rate increased but otherwise similar Confirmed by Arby Barrette [phone]) on 04/17/2023 8:17:42 AM  Radiology CT Angio Chest PE W and/or Wo Contrast Result Date: 04/17/2023 CLINICAL DATA:  Cough and back pain.  Difficulty breathing. EXAM: CT ANGIOGRAPHY CHEST WITH CONTRAST TECHNIQUE: Multidetector CT imaging of the chest was performed using the standard protocol during bolus administration of intravenous contrast. Multiplanar CT image reconstructions and MIPs were obtained to evaluate the vascular anatomy. RADIATION DOSE REDUCTION: This exam was performed according to the departmental dose-optimization program which includes automated exposure control, adjustment of the mA and/or kV according to patient size and/or use of iterative reconstruction technique. CONTRAST:  75mL OMNIPAQUE IOHEXOL 350 MG/ML SOLN COMPARISON:  None Available. FINDINGS: Cardiovascular: The heart size is normal. No substantial pericardial effusion. No thoracic aortic aneurysm. No substantial atherosclerosis of the thoracic aorta. Nonocclusive thrombus is identified in segmental and subsegmental pulmonary  arteries to the right lower lobe (see image 186/12). There is also segmental and subsegmental pulmonary embolus to the left lower lobe. RV/LV ratio is 0.86. Mediastinum/Nodes: No mediastinal lymphadenopathy. There is no hilar lymphadenopathy. Esophagus is patulous. Sartaj Hoskin to moderate hiatal hernia. There is no axillary lymphadenopathy. Lungs/Pleura: No suspicious pulmonary nodule or mass. Ehan Freas focus of peripheral consolidative opacity in the anterior left lower lobe may reflect infarct. There is patchy ground-glass opacity in the lung bases that could be atelectasis or evolving infarct. No pleural effusion. Upper Abdomen: Visualized portion of the upper abdomen shows no acute findings. Musculoskeletal: No worrisome lytic or sclerotic osseous abnormality. Review of the MIP images confirms the above findings. IMPRESSION: 1. Positive for bilateral lower lobe segmental and  subsegmental pulmonary emboli. No evidence for right heart strain. Socorro Ebron focus of peripheral consolidative opacity in the anterior left lower lobe may reflect infarct. 2. Patchy ground-glass opacity in the lung bases could be atelectasis or evolving infarct. 3. Hatsumi Steinhart to moderate hiatal hernia. Critical Value/emergent results were called by telephone at the time of interpretation on 04/17/2023 at 11:05 am to provider Dr. Donnald Garre, who verbally acknowledged these results. Electronically Signed   By: Kennith Center M.D.   On: 04/17/2023 11:08   DG Chest Portable 1 View Result Date: 04/17/2023 CLINICAL DATA:  68 year old female with increasing cough, shortness of breath. EXAM: PORTABLE CHEST 1 VIEW COMPARISON:  Chest radiographs 05/07/2017 and earlier. FINDINGS: Portable AP semi upright view at 0810 hours. Background large lung volumes, low lung volumes now, and the patient is rotated to the left. Accentuation of cardiac and mediastinal contours. Visualized tracheal air column is within normal limits. No pneumothorax or pleural effusion. Bibasilar hypo  ventilation. No air bronchograms. Normal pulmonary vasculature in the aerated lung. Paucity of bowel gas.  No acute osseous abnormality identified. IMPRESSION: Low lung volumes with bibasilar hypoventilation. Follow-up PA and lateral views of the chest would be helpful when feasible. Electronically Signed   By: Odessa Fleming M.D.   On: 04/17/2023 09:00    Procedures Procedures    Medications Ordered in ED Medications  HYDROmorphone (DILAUDID) injection 1 mg (has no administration in time range)  acetaminophen (TYLENOL) tablet 1,000 mg (has no administration in time range)  ondansetron (ZOFRAN) injection 4 mg (4 mg Intravenous Given 04/17/23 0730)  fentaNYL (SUBLIMAZE) injection 50 mcg (50 mcg Intravenous Given 04/17/23 0730)  HYDROmorphone (DILAUDID) injection 0.5 mg (0.5 mg Intravenous Given 04/17/23 0924)  promethazine (PHENERGAN) 12.5 mg in sodium chloride 0.9 % 50 mL IVPB (0 mg Intravenous Stopped 04/17/23 1005)  iohexol (OMNIPAQUE) 350 MG/ML injection 75 mL (75 mLs Intravenous Contrast Given 04/17/23 0935)  oxyCODONE-acetaminophen (PERCOCET/ROXICET) 5-325 MG per tablet 1 tablet (1 tablet Oral Given 04/17/23 1054)  diazepam (VALIUM) tablet 2 mg (2 mg Oral Given 04/17/23 1055)    ED Course/ Medical Decision Making/ A&P                                 Medical Decision Making Patient is a 68 year old female, here for right thoracic back pain, and worsening shortness of breath, she has a shortness of breath, and a cough for the last few months, and she is now having worsening pain, and shortness of breath.  She is overall well-appearing, but there is some hypoventilation present.  She does not want to take a deep breath, as it is pleuritic pain.  She states her pain is a 10 out of 10.  She had lab work done by her primary care doctor yesterday, which showed elevated D-dimer of 3000.  She is here because it is acutely worse today.  She is tachycardic we will order a CTA/PE, study for further evaluation as well  as troponin, and BNP.  Will start on fentanyl, for pain control, and escalate as needed.  She has no evidence of any rashes on exam.  Amount and/or Complexity of Data Reviewed Labs: ordered.    Details: Troponin within normal limits, negative BNP Radiology: ordered.    Details: Chest x-ray clear, except for evidence of some atelectasis, CTA PE, shows positive for bilateral lower segmental, and subsegmental PE, as well as developing a pulmonary infarct. Discussion of management  or test interpretation with external provider(s): Patient's pain escalated, has now had 3+ pain medications without relief, will increase Dilaudid to 1 mg, see if any relief.  She has bilateral pulmonary emboli, with a developing pulmonary infarct, I will start her on heparin, and admit to hospitalist.  She is not febrile, I am suspicious that this is likely secondary to the pulmonary infarct.  She is not requiring any oxygen, but was put on oxygen for comfort.  Admitted to Dr. Erenest Blank, for further management.  Risk OTC drugs. Prescription drug management. Decision regarding hospitalization.    Final Clinical Impression(s) / ED Diagnoses Final diagnoses:  Bilateral pulmonary embolism [facility])  Pulmonary infarct [facility])    Rx / DC Orders ED Discharge Orders     None         Wilena Tyndall, Harley [place], PA 04/17/23 1145    Arby Barrette, MD 04/18/23 1629

## 2023-04-17 NOTE — ED Provider Notes (Incomplete)
 I provided a substantive portion of the care of this patient.  I personally made/approved the management plan for this patient and take responsibility for the patient management. {Remember to document shared critical care using "edcritical" dot phrase:1} EKG Interpretation Date/Time:  Tuesday April 17 2023 07:45:49 EDT Ventricular Rate:  101 PR Interval:  159 QRS Duration:  69 QT Interval:  304 QTC Calculation: 394 R Axis:   53  Text Interpretation: Sinus tachycardia ST elevation, consider inferior injury no stemi. prior tracing shows diffuse ST elevation. rate increased but otherwise similar Confirmed by Arby Barrette [phone]) on 04/17/2023 8:17:42 AM

## 2023-04-17 NOTE — H&P (Signed)
 History and Physical  Shannon Henderson ZOX:[id number] DOB: 01-02-1956 DOA: 04/17/2023  PCP: System, Provider Not In   Chief Complaint: Cough, chest pain  HPI: Shannon Henderson is a 68 y.o. 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  History is provided by the patient as well as her husband who is at the bedside, they state that she has had a chronic nonproductive cough for the last month or 2, without sputum production, chest pain, fevers or chills.  This morning, she had sudden onset of severe low back pain, worse with inspiration.  Workup as detailed below shows evidence of acute bilateral and subsegmental PE.  On further discussion, patient states she just returned back about a week ago from driving down to [place] and back.  She denies any lower extremity swelling or pain.  Review of Systems: Please see HPI for pertinent positives and negatives. A complete 10 system review of systems are otherwise negative.  Past Medical History:  Diagnosis Date   Amputee, below knee, right (HCC)    Fracture of multiple ribs 04/19/2017   L ribs 5-10 closed fractures   Pneumothorax on left 04/19/2017   s/p fall from horse   History reviewed. No pertinent surgical history. Social History:  reports that she has never smoked. She has never used smokeless tobacco. She reports that she does not drink alcohol and does not use drugs.  No Known Allergies  Family History  Problem Relation Age of Onset   Breast cancer Mother 71     Prior to Admission medications   Medication Sig Start Date End Date Taking? Authorizing Provider  b complex vitamins tablet Take 1 tablet by mouth daily.    [provider]  CALCIUM PO Take 250 mg by mouth 2 (two) times daily.    [provider]  mirtazapine (REMERON) 15 MG tablet  Take 15 mg by mouth at bedtime. 03/17/17   [provider]  Multiple Vitamin (MULTIVITAMIN) capsule Take 1 capsule by mouth daily.    [provider]  Omega-3 Fatty Acids (COROMEGA OMEGA 3 SQUEEZE) EMUL Take 1 capsule by mouth daily.    [provider]  oxyCODONE (OXY IR/ROXICODONE) 5 MG immediate release tablet Take 1 tablet (5 mg total) by mouth every 6 (six) hours as needed for severe pain. 04/21/17   Meuth, Lina Sar, PA-C  Specialty Vitamins Products (MAGNESIUM, AMINO ACID CHELATE,) 133 MG tablet Take 1 tablet by mouth 3 (three) times daily.    [provider]  zolmitriptan (ZOMIG-ZMT) 5 MG disintegrating tablet Take 5 mg by mouth daily as needed. Earliest sign of migraine 03/23/17   [provider]    Physical Exam: BP (!) 127/54 (BP Location: Left Arm)   Pulse (!) 106   Temp (!) 101.7 F (38.7 C) (Oral)   Resp (!) 27   Ht 6\' 1"  (1.854 m)   Wt 78.5 kg   SpO2 97%   BMI 22.82 kg/m  General:  Alert, oriented, calm, in no acute distress, wearing 2 L nasal cannula oxygen for comfort.  She is awake alert oriented.  Looks uncomfortable due to low back pain Cardiovascular: RRR, no murmurs or rubs, no peripheral edema  Respiratory: clear to auscultation bilaterally, no wheezes, no crackles  Abdomen: soft, nontender, nondistended, normal bowel tones heard  Skin: dry, no rashes  Musculoskeletal:  no joint effusions, normal range of motion, status post right BKA Psychiatric: appropriate affect, normal speech  Neurologic: extraocular muscles intact, clear speech, moving all extremities with intact sensorium         Labs on Admission:  Basic Metabolic Panel: Recent Labs  Lab 04/17/23 0711  NA 136  K 4.1  CL 102  CO2 26  GLUCOSE 110*  BUN 19  CREATININE 0.64  CALCIUM 8.9   Liver Function Tests: Recent Labs  Lab 04/17/23 0711  AST 18  ALT 35  ALKPHOS 21*  BILITOT 2.0*  PROT 6.4*  ALBUMIN 3.2*   No results for input(s): "LIPASE",  "AMYLASE" in the last 168 hours. No results for input(s): "AMMONIA" in the last 168 hours. CBC: Recent Labs  Lab 04/17/23 0711  WBC 9.7  NEUTROABS 6.9  HGB 12.6  HCT 40.2  MCV 97.3  PLT 241   Cardiac Enzymes: No results for input(s): "CKTOTAL", "CKMB", "CKMBINDEX", "TROPONINI" in the last 168 hours. BNP (last 3 results) Recent Labs    04/17/23 0711  BNP 94.8    ProBNP (last 3 results) No results for input(s): "PROBNP" in the last 8760 hours.  CBG: No results for input(s): "GLUCAP" in the last 168 hours.  Radiological Exams on Admission: CT Angio Chest PE W and/or Wo Contrast Result Date: 04/17/2023 CLINICAL DATA:  Cough and back pain.  Difficulty breathing. EXAM: CT ANGIOGRAPHY CHEST WITH CONTRAST TECHNIQUE: Multidetector CT imaging of the chest was performed using the standard protocol during bolus administration of intravenous contrast. Multiplanar CT image reconstructions and MIPs were obtained to evaluate the vascular anatomy. RADIATION DOSE REDUCTION: This exam was performed according to the departmental dose-optimization program which includes automated exposure control, adjustment of the mA and/or kV according to patient size and/or use of iterative reconstruction technique. CONTRAST:  75mL OMNIPAQUE IOHEXOL 350 MG/ML SOLN COMPARISON:  None Available. FINDINGS: Cardiovascular: The heart size is normal. No substantial pericardial effusion. No thoracic aortic aneurysm. No substantial atherosclerosis of the thoracic aorta. Nonocclusive thrombus is identified in segmental and subsegmental pulmonary arteries to the right lower lobe (see image 186/12). There is also segmental and subsegmental pulmonary embolus to the left lower lobe. RV/LV ratio is 0.86. Mediastinum/Nodes: No mediastinal lymphadenopathy. There is no hilar lymphadenopathy. Esophagus is patulous. Small to moderate hiatal hernia. There is no axillary lymphadenopathy. Lungs/Pleura: No suspicious pulmonary nodule or mass.  Small focus of peripheral consolidative opacity in the anterior left lower lobe may reflect infarct. There is patchy ground-glass opacity in the lung bases that could be atelectasis or evolving infarct. No pleural effusion. Upper Abdomen: Visualized portion of the upper abdomen shows no acute findings. Musculoskeletal: No worrisome lytic or sclerotic osseous abnormality. Review of the MIP images confirms the above findings. IMPRESSION: 1. Positive for bilateral lower lobe segmental and subsegmental pulmonary emboli. No evidence for right heart strain. Small focus of peripheral consolidative opacity in the anterior left lower lobe may reflect infarct. 2. Patchy ground-glass opacity in the lung bases could be atelectasis or evolving infarct. 3. Small to moderate hiatal hernia. Critical Value/emergent results were called by telephone at the time of interpretation on 04/17/2023 at 11:05 am to provider Dr. Donnald Garre, who verbally acknowledged these results. Electronically Signed   By: Kennith Center M.D.   On: 04/17/2023 11:08   DG Chest Portable 1 View Result Date: 04/17/2023 CLINICAL DATA:  68 year old female with increasing cough, shortness of breath. EXAM: PORTABLE CHEST 1 VIEW COMPARISON:  Chest radiographs 05/07/2017 and earlier. FINDINGS: Portable  AP semi upright view at 0810 hours. Background large lung volumes, low lung volumes now, and the patient is rotated to the left. Accentuation of cardiac and mediastinal contours. Visualized tracheal air column is within normal limits. No pneumothorax or pleural effusion. Bibasilar hypo ventilation. No air bronchograms. Normal pulmonary vasculature in the aerated lung. Paucity of bowel gas.  No acute osseous abnormality identified. IMPRESSION: Low lung volumes with bibasilar hypoventilation. Follow-up PA and lateral views of the chest would be helpful when feasible. Electronically Signed   By: Odessa Fleming M.D.   On: 04/17/2023 09:00   Assessment/Plan Letrice Pollok is a 68  y.o. 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  Acute bilateral PE-without evidence of heart strain, though she does have some mild tachycardia and asymptomatic hypotension.  Initially she was hypertensive, hypotension may be related to narcotics which she received.  I suspect this is provoked by her recent long drive to [place] and back.  Patient and wife state that she has had appropriate cancer screening including mammograms and colonoscopy.  Denies any recent worrisome symptoms such as weight loss, chronic pain, nausea etc. -Observation admission -Telemetry monitoring -Initiate IV heparin drip -Given tachycardia and mild hypotension, will check 2D echo to rule out heart strain -Check lower extremity Dopplers -Pain control with p.o. and IV narcotics    Code Status: Full Code  Consults called: None  Admission status: Observation  Time spent: 55 minutes  Shannon Groll Sharlette Dense MD [facility] Pager [phone]  If 7PM-7AM, please contact night-coverage [email or web address] Password [facility]  04/17/2023, 11:47 AM

## 2023-04-17 NOTE — Progress Notes (Signed)
 PHARMACY - ANTICOAGULATION CONSULT NOTE  Pharmacy Consult for heparin Indication: pulmonary embolus  No Known Allergies  Patient Measurements: Height: 6\' 1"  (185.4 cm) Weight: 78.5 kg (173 lb) IBW/kg (Calculated) : 75.4 HEPARIN DW (KG): 78.5  Vital Signs: Temp: 101.7 F (38.7 C) (04/01 1055) Temp Source: Oral (04/01 1055) BP: 127/54 (04/01 0930) Pulse Rate: 106 (04/01 1055)  Labs: Recent Labs    04/17/23 0711  HGB 12.6  HCT 40.2  PLT 241  CREATININE 0.64  TROPONINIHS 6    Estimated Creatinine Clearance: 81.2 mL/min (by C-G formula based on SCr of 0.64 mg/dL).   Medical History: Past Medical History:  Diagnosis Date   Amputee, below knee, right (HCC)    Fracture of multiple ribs 04/19/2017   L ribs 5-10 closed fractures   Pneumothorax on left 04/19/2017   s/p fall from horse    Medications:  No anticoagulants PTA Assessment: 68 yo F with B PE.  Pharmacy consulted for heparin dosing.  CBC WNL, SCr WNL Goal of Therapy:  Heparin level 0.3-0.7 units/ml Monitor platelets by anticoagulation protocol: Yes   Plan:  Give 3000 units bolus x 1 Start heparin infusion at 1300 units/hr Check anti-Xa level in 6 hours and daily while on heparin Continue to monitor H&H and platelets  Herby Abraham, Pharm.D Use secure chat for questions 04/17/2023 11:30 AM

## 2023-04-18 ENCOUNTER — Inpatient Hospital Stay (HOSPITAL_COMMUNITY)

## 2023-04-18 ENCOUNTER — Telehealth (HOSPITAL_COMMUNITY): Payer: Self-pay | Admitting: Pharmacy Technician

## 2023-04-18 ENCOUNTER — Other Ambulatory Visit (HOSPITAL_COMMUNITY): Payer: Self-pay

## 2023-04-18 DIAGNOSIS — I2699 Other pulmonary embolism without acute cor pulmonale: Secondary | ICD-10-CM | POA: Diagnosis not present

## 2023-04-18 DIAGNOSIS — Z86711 Personal history of pulmonary embolism: Secondary | ICD-10-CM | POA: Diagnosis not present

## 2023-04-18 LAB — CBC
HCT: 37.8 % (ref 36.0–46.0)
Hemoglobin: 12 g/dL (ref 12.0–15.0)
MCH: 31.1 pg (ref 26.0–34.0)
MCHC: 31.7 g/dL (ref 30.0–36.0)
MCV: 97.9 fL (ref 80.0–100.0)
Platelets: 236 10*3/uL (ref 150–400)
RBC: 3.86 MIL/uL — ABNORMAL LOW (ref 3.87–5.11)
RDW: 13.5 % (ref 11.5–15.5)
WBC: 14.8 10*3/uL — ABNORMAL HIGH (ref 4.0–10.5)
nRBC: 0 % (ref 0.0–0.2)

## 2023-04-18 LAB — BASIC METABOLIC PANEL WITH GFR
Anion gap: 12 (ref 5–15)
BUN: 17 mg/dL (ref 8–23)
CO2: 22 mmol/L (ref 22–32)
Calcium: 9 mg/dL (ref 8.9–10.3)
Chloride: 100 mmol/L (ref 98–111)
Creatinine, Ser: 0.7 mg/dL (ref 0.44–1.00)
GFR, Estimated: >60 mL/min (ref >60)
Glucose, Bld: 80 mg/dL (ref 70–99)
Potassium: 4.5 mmol/L (ref 3.5–5.1)
Sodium: 134 mmol/L — ABNORMAL LOW (ref 135–145)

## 2023-04-18 LAB — HEPARIN LEVEL (UNFRACTIONATED)
Heparin Unfractionated: 0.65 [IU]/mL (ref 0.30–0.70)
Heparin Unfractionated: 0.66 [IU]/mL (ref 0.30–0.70)

## 2023-04-18 MED ORDER — SODIUM CHLORIDE 0.9 % IV SOLN: INTRAVENOUS | Status: AC

## 2023-04-18 MED ORDER — KETOROLAC TROMETHAMINE 30 MG/ML IJ SOLN
15.0000 mg | Freq: Once | INTRAMUSCULAR | Status: AC
  Administered 2023-04-18: 15 mg via INTRAVENOUS
  Filled 2023-04-18: qty 1

## 2023-04-18 MED ORDER — SODIUM CHLORIDE 0.9 % IV SOLN
2.0000 g | INTRAVENOUS | Status: DC
  Administered 2023-04-18 – 2023-04-20 (×3): 2 g via INTRAVENOUS
  Filled 2023-04-18 (×3): qty 20

## 2023-04-18 MED ORDER — DIPHENHYDRAMINE HCL 25 MG PO CAPS
25.0000 mg | ORAL_CAPSULE | Freq: Once | ORAL | Status: AC
  Administered 2023-04-18: 25 mg via ORAL
  Filled 2023-04-18: qty 1

## 2023-04-18 MED ORDER — SODIUM CHLORIDE 0.9 % IV SOLN
500.0000 mg | INTRAVENOUS | Status: DC
  Administered 2023-04-18 – 2023-04-20 (×3): 500 mg via INTRAVENOUS
  Filled 2023-04-18 (×4): qty 5

## 2023-04-18 NOTE — Progress Notes (Signed)
 LLE venous duplex has been completed.   Results can be found under chart review under CV PROC. 04/18/2023 2:29 PM Jelani Trueba RVT, RDMS

## 2023-04-18 NOTE — Plan of Care (Signed)

## 2023-04-18 NOTE — Progress Notes (Signed)
 PHARMACY - ANTICOAGULATION CONSULT NOTE  Pharmacy Consult for heparin Indication: pulmonary embolus  No Known Allergies  Patient Measurements: Height: 6\' 1"  (185.4 cm) Weight: 78.5 kg (173 lb) IBW/kg (Calculated) : 75.4 HEPARIN DW (KG): 78.5  Vital Signs: Temp: 99.3 F (37.4 C) (04/02 0452) Temp Source: Oral (04/02 0452) BP: 126/61 (04/02 0452) Pulse Rate: 110 (04/02 0452)  Labs: Recent Labs    04/17/23 0711 04/17/23 1043 04/17/23 1737 04/17/23 2359 04/18/23 0456  HGB 12.6  --   --   --  12.0  HCT 40.2  --   --   --  37.8  PLT 241  --   --   --  236  HEPARINUNFRC  --   --  0.65 0.66 0.65  CREATININE 0.64  --   --   --  0.70  TROPONINIHS 6 8  --   --   --     Estimated Creatinine Clearance: 81.2 mL/min (by C-G formula based on SCr of 0.7 mg/dL).   Medical History: Past Medical History:  Diagnosis Date   Amputee, below knee, right (HCC)    Fracture of multiple ribs 04/19/2017   L ribs 5-10 closed fractures   Pneumothorax on left 04/19/2017   s/p fall from horse    Medications: No anticoagulants PTA  Assessment: Shannon Henderson presenting with cough, chest pain. CTA revealed bilateral PE, no evidence of RHS. Pharmacy consulted to dose heparin.   Today, 04/18/23 Heparin level = 0.65 remains therapeutic on heparin infusion of 1300 units/hr CBC: Stable, WNL No bleeding reported  Goal of Therapy:  Heparin level 0.3-0.7 units/ml Monitor platelets by anticoagulation protocol: Yes   Plan:  Continue heparin at 1300 units/hr CBC, heparin level daily Monitor for bleeding Copay check requested for DOACs  Cindi Carbon, PharmD 04/18/23 9:36 AM

## 2023-04-18 NOTE — Progress Notes (Signed)
 PHARMACY - ANTICOAGULATION CONSULT NOTE  Pharmacy Consult for heparin Indication: pulmonary embolus  No Known Allergies  Patient Measurements: Height: 6\' 1"  (185.4 cm) Weight: 78.5 kg (173 lb) IBW/kg (Calculated) : 75.4 HEPARIN DW (KG): 78.5  Vital Signs: Temp: 100.3 F (37.9 C) (04/02 0007) Temp Source: Oral (04/01 2033) BP: 127/70 (04/02 0007) Pulse Rate: 100 (04/02 0007)  Labs: Recent Labs    04/17/23 0711 04/17/23 1043 04/17/23 1737 04/17/23 2359  HGB 12.6  --   --   --   HCT 40.2  --   --   --   PLT 241  --   --   --   HEPARINUNFRC  --   --  0.65 0.66  CREATININE 0.64  --   --   --   TROPONINIHS 6 8  --   --     Estimated Creatinine Clearance: 81.2 mL/min (by C-G formula based on SCr of 0.64 mg/dL).   Medical History: Past Medical History:  Diagnosis Date   Amputee, below knee, right (HCC)    Fracture of multiple ribs 04/19/2017   L ribs 5-10 closed fractures   Pneumothorax on left 04/19/2017   s/p fall from horse    Medications:  No anticoagulants PTA  Assessment: 68 yo F with B PE.  Pharmacy consulted for heparin dosing.  CBC WNL, SCr WNL  Today, 04/18/23 17:37 Heparin level 0.66, therapeutic x2 with IV heparin infusing at 1300 units/hr No bleeding or heparin related issues per nurse CBC WNL  Goal of Therapy:  Heparin level 0.3-0.7 units/ml Monitor platelets by anticoagulation protocol: Yes   Plan:  Continue IV heparin at 1300 units/hr Monitor daily heparin level, CBC, signs/symptoms of bleeding F/U long-term anticoagulation plans  Thank you for allowing pharmacy to be a part of this patient's care.  Junita Push, PharmD, BCPS 04/18/2023 12:28 AM

## 2023-04-18 NOTE — Progress Notes (Signed)
 PROGRESS NOTE    Shannon Henderson  NWG:[id number] DOB: 1955/03/11 DOA: 04/17/2023 PCP: System, Provider Not In    Brief Narrative:  Shannon Henderson is a 68 y.o. 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  History is provided by the patient as well as her husband who is at the bedside, they state that she has had a chronic nonproductive cough for the last month or 2, without sputum production, chest pain, fevers or chills.  This morning, she had sudden onset of severe low back pain, worse with inspiration.  Workup as detailed below shows evidence of acute bilateral and subsegmental PE.  On further discussion, patient states she just returned back about a week ago from driving down to [place] and back.  She denies any lower extremity swelling or pain.    Assessment and Plan: Acute bilateral PE-without evidence of heart strain, though she does have some mild tachycardia and asymptomatic hypotension.  Initially she was hypertensive, hypotension may be related to narcotics which she received.  I suspect this is provoked by her recent long drive to [place] and back.  Patient and wife state that she has had appropriate cancer screening including mammograms and colonoscopy.  Denies any recent worrisome symptoms such as weight loss, chronic pain, nausea etc. -Initiate IV heparin drip- change to PO once able to tolerate PO -echo -lower extremity Dopplers -Pain control with p.o. and IV narcotics -add abx  Nausea -IVF -PRN anti-emetics    DVT prophylaxis:     Code Status: Full Code Family Communication: at bedside  Disposition Plan:  Level of care: Progressive Status is: Inpatient     Consultants:  none   Subjective: Does not feel well   Objective: Vitals:   04/17/23 1531 04/17/23 2033 04/18/23 0007  04/18/23 0452  BP: 123/65 134/66 127/70 126/61  Pulse: 88 88 100 (!) 110  Resp: 17 19 19 19   Temp: 98.8 F (37.1 C) 99.4 F (37.4 C) 100.3 F (37.9 C) 99.3 F (37.4 C)  TempSrc: Oral Oral  Oral  SpO2: 99% 99% 97% 98%  Weight:      Height:        Intake/Output Summary (Last 24 hours) at 04/18/2023 1133 Last data filed at 04/18/2023 0000 Gross per 24 hour  Intake 183.79 ml  Output --  Net 183.79 ml   Filed Weights   04/17/23 0640  Weight: 78.5 kg    Examination:   General: Appearance:    Well developed, well nourished female in no acute distress     Lungs:     respirations unlabored  Heart:    Tachycardic.    MS:   All extremities are intact.    Neurologic:   Awake, alert       Data Reviewed: I have personally reviewed following labs and imaging studies  CBC: Recent Labs  Lab 04/17/23 0711 04/18/23 0456  WBC 9.7 14.8*  NEUTROABS 6.9  --   HGB 12.6 12.0  HCT 40.2 37.8  MCV 97.3 97.9  PLT 241 236   Basic Metabolic Panel: Recent Labs  Lab 04/17/23 0711 04/18/23 0456  NA 136 134*  K 4.1 4.5  CL 102 100  CO2 26 22  GLUCOSE 110* 80  BUN 19 17  CREATININE 0.64 0.70  CALCIUM 8.9 9.0   GFR: Estimated Creatinine Clearance: 81.2 mL/min (  by C-G formula based on SCr of 0.7 mg/dL). Liver Function Tests: Recent Labs  Lab 04/17/23 0711  AST 18  ALT 35  ALKPHOS 21*  BILITOT 2.0*  PROT 6.4*  ALBUMIN 3.2*   No results for input(s): "LIPASE", "AMYLASE" in the last 168 hours. No results for input(s): "AMMONIA" in the last 168 hours. Coagulation Profile: No results for input(s): "INR", "PROTIME" in the last 168 hours. Cardiac Enzymes: No results for input(s): "CKTOTAL", "CKMB", "CKMBINDEX", "TROPONINI" in the last 168 hours. BNP (last 3 results) No results for input(s): "PROBNP" in the last 8760 hours. HbA1C: No results for input(s): "HGBA1C" in the last 72 hours. CBG: No results for input(s): "GLUCAP" in the last 168 hours. Lipid Profile: No  results for input(s): "CHOL", "HDL", "LDLCALC", "TRIG", "CHOLHDL", "LDLDIRECT" in the last 72 hours. Thyroid Function Tests: No results for input(s): "TSH", "T4TOTAL", "FREET4", "T3FREE", "THYROIDAB" in the last 72 hours. Anemia Panel: No results for input(s): "VITAMINB12", "FOLATE", "FERRITIN", "TIBC", "IRON", "RETICCTPCT" in the last 72 hours. Sepsis Labs: No results for input(s): "PROCALCITON", "LATICACIDVEN" in the last 168 hours.  No results found for this or any previous visit (from the past 240 hours).       Radiology Studies: ECHOCARDIOGRAM COMPLETE Result Date: 04/17/2023    ECHOCARDIOGRAM REPORT   Patient Name:   Shannon Henderson Date of Exam: 04/17/2023 Medical Rec #:  [id number]        Height:       73.0 in Accession #:    [id number]       Weight:       173.0 lb Date of Birth:  Feb 26, 1955         BSA:          2.023 m Patient Age:    68 years         BP:           99/73 mmHg Patient Gender: F                HR:           91 bpm. Exam Location:  Inpatient Procedure: 2D Echo, Cardiac Doppler and Color Doppler (Both Spectral and Color            Flow Doppler were utilized during procedure). Indications:    Pulmonary embolus  History:        Patient has no prior history of Echocardiogram examinations.  Sonographer:    Vern Claude Referring Phys: [id number] MIR M [facility] IMPRESSIONS  1. Left ventricular ejection fraction, by estimation, is 70 to 75%. The left ventricle has hyperdynamic function. The left ventricle has no regional wall motion abnormalities. Left ventricular diastolic parameters are consistent with Grade II diastolic dysfunction (pseudonormalization).  2. Right ventricular systolic function is normal. The right ventricular size is normal. There is normal pulmonary artery systolic pressure.  3. The mitral valve is normal in structure. Trivial mitral valve regurgitation. No evidence of mitral stenosis.  4. The aortic valve is normal in structure. Aortic valve regurgitation is not  visualized. No aortic stenosis is present.  5. The inferior vena cava is normal in size with greater than 50% respiratory variability, suggesting right atrial pressure of 3 mmHg. FINDINGS  Left Ventricle: Left ventricular ejection fraction, by estimation, is 70 to 75%. The left ventricle has hyperdynamic function. The left ventricle has no regional wall motion abnormalities. The left ventricular internal cavity size was normal in size. There is no left ventricular hypertrophy. Left ventricular diastolic parameters  are consistent with Grade II diastolic dysfunction (pseudonormalization). Right Ventricle: The right ventricular size is normal. No increase in right ventricular wall thickness. Right ventricular systolic function is normal. There is normal pulmonary artery systolic pressure. The tricuspid regurgitant velocity is 1.47 m/s, and  with an assumed right atrial pressure of 3 mmHg, the estimated right ventricular systolic pressure is 11.6 mmHg. Left Atrium: Left atrial size was normal in size. Right Atrium: Right atrial size was normal in size. Pericardium: There is no evidence of pericardial effusion. Mitral Valve: The mitral valve is normal in structure. Trivial mitral valve regurgitation. No evidence of mitral valve stenosis. MV peak gradient, 3.8 mmHg. The mean mitral valve gradient is 2.0 mmHg. Tricuspid Valve: The tricuspid valve is normal in structure. Tricuspid valve regurgitation is trivial. No evidence of tricuspid stenosis. Aortic Valve: The aortic valve is normal in structure. Aortic valve regurgitation is not visualized. No aortic stenosis is present. Aortic valve mean gradient measures 5.0 mmHg. Aortic valve peak gradient measures 8.6 mmHg. Aortic valve area, by VTI measures 2.70 cm. Pulmonic Valve: The pulmonic valve was grossly normal. Pulmonic valve regurgitation is not visualized. No evidence of pulmonic stenosis. Aorta: The aortic root is normal in size and structure. Venous: The inferior  vena cava is normal in size with greater than 50% respiratory variability, suggesting right atrial pressure of 3 mmHg. IAS/Shunts: No atrial level shunt detected by color flow Doppler.  LEFT VENTRICLE PLAX 2D LVIDd:         3.60 cm      Diastology LVIDs:         2.50 cm      LV e' medial:    7.51 cm/s LV PW:         0.70 cm      LV E/e' medial:  13.2 LV IVS:        0.70 cm      LV e' lateral:   8.70 cm/s LVOT diam:     1.80 cm      LV E/e' lateral: 11.4 LV SV:         63 LV SV Index:   31 LVOT Area:     2.54 cm  LV Volumes (MOD) LV vol d, MOD A2C: 93.6 ml LV vol d, MOD A4C: 109.0 ml LV vol s, MOD A2C: 24.9 ml LV vol s, MOD A4C: 29.5 ml LV SV MOD A2C:     68.7 ml LV SV MOD A4C:     109.0 ml LV SV MOD BP:      75.2 ml RIGHT VENTRICLE             IVC RV Basal diam:  3.10 cm     IVC diam: 1.00 cm RV Mid diam:    3.10 cm RV S prime:     28.10 cm/s TAPSE (M-mode): 2.4 cm LEFT ATRIUM             Index        RIGHT ATRIUM           Index LA diam:        2.40 cm 1.19 cm/m   RA Area:     11.00 cm LA Vol (A2C):   30.9 ml 15.27 ml/m  RA Volume:   21.60 ml  10.68 ml/m LA Vol (A4C):   23.8 ml 11.76 ml/m LA Biplane Vol: 29.6 ml 14.63 ml/m  AORTIC VALVE  PULMONIC VALVE AV Area (Vmax):    2.42 cm      PV Vmax:       0.95 m/s AV Area (Vmean):   2.68 cm      PV Peak grad:  3.6 mmHg AV Area (VTI):     2.70 cm AV Vmax:           147.00 cm/s AV Vmean:          94.900 cm/s AV VTI:            0.232 m AV Peak Grad:      8.6 mmHg AV Mean Grad:      5.0 mmHg LVOT Vmax:         140.00 cm/s LVOT Vmean:        100.000 cm/s LVOT VTI:          0.246 m LVOT/AV VTI ratio: 1.06  AORTA Ao Root diam: 3.60 cm Ao Asc diam:  2.50 cm MITRAL VALVE               TRICUSPID VALVE MV Area (PHT): 3.19 cm    TR Peak grad:   8.6 mmHg MV Area VTI:   2.66 cm    TR Vmax:        147.00 cm/s MV Peak grad:  3.8 mmHg MV Mean grad:  2.0 mmHg    SHUNTS MV Vmax:       0.97 m/s    Systemic VTI:  0.25 m MV Vmean:      68.9 cm/s   Systemic Diam:  1.80 cm MV Decel Time: 238 msec MV E velocity: 99.20 cm/s MV A velocity: 75.70 cm/s MV E/A ratio:  1.31 Arvilla Meres MD Electronically signed by Arvilla Meres MD Signature Date/Time: 04/17/2023/2:57:57 PM    Final    CT Angio Chest PE W and/or Wo Contrast Result Date: 04/17/2023 CLINICAL DATA:  Cough and back pain.  Difficulty breathing. EXAM: CT ANGIOGRAPHY CHEST WITH CONTRAST TECHNIQUE: Multidetector CT imaging of the chest was performed using the standard protocol during bolus administration of intravenous contrast. Multiplanar CT image reconstructions and MIPs were obtained to evaluate the vascular anatomy. RADIATION DOSE REDUCTION: This exam was performed according to the departmental dose-optimization program which includes automated exposure control, adjustment of the mA and/or kV according to patient size and/or use of iterative reconstruction technique. CONTRAST:  75mL OMNIPAQUE IOHEXOL 350 MG/ML SOLN COMPARISON:  None Available. FINDINGS: Cardiovascular: The heart size is normal. No substantial pericardial effusion. No thoracic aortic aneurysm. No substantial atherosclerosis of the thoracic aorta. Nonocclusive thrombus is identified in segmental and subsegmental pulmonary arteries to the right lower lobe (see image 186/12). There is also segmental and subsegmental pulmonary embolus to the left lower lobe. RV/LV ratio is 0.86. Mediastinum/Nodes: No mediastinal lymphadenopathy. There is no hilar lymphadenopathy. Esophagus is patulous. Small to moderate hiatal hernia. There is no axillary lymphadenopathy. Lungs/Pleura: No suspicious pulmonary nodule or mass. Small focus of peripheral consolidative opacity in the anterior left lower lobe may reflect infarct. There is patchy ground-glass opacity in the lung bases that could be atelectasis or evolving infarct. No pleural effusion. Upper Abdomen: Visualized portion of the upper abdomen shows no acute findings. Musculoskeletal: No worrisome lytic or  sclerotic osseous abnormality. Review of the MIP images confirms the above findings. IMPRESSION: 1. Positive for bilateral lower lobe segmental and subsegmental pulmonary emboli. No evidence for right heart strain. Small focus of peripheral consolidative opacity in the anterior left lower lobe may reflect infarct. 2.  Patchy ground-glass opacity in the lung bases could be atelectasis or evolving infarct. 3. Small to moderate hiatal hernia. Critical Value/emergent results were called by telephone at the time of interpretation on 04/17/2023 at 11:05 am to provider Dr. Donnald Garre, who verbally acknowledged these results. Electronically Signed   By: Kennith Center M.D.   On: 04/17/2023 11:08   DG Chest Portable 1 View Result Date: 04/17/2023 CLINICAL DATA:  68 year old female with increasing cough, shortness of breath. EXAM: PORTABLE CHEST 1 VIEW COMPARISON:  Chest radiographs 05/07/2017 and earlier. FINDINGS: Portable AP semi upright view at 0810 hours. Background large lung volumes, low lung volumes now, and the patient is rotated to the left. Accentuation of cardiac and mediastinal contours. Visualized tracheal air column is within normal limits. No pneumothorax or pleural effusion. Bibasilar hypo ventilation. No air bronchograms. Normal pulmonary vasculature in the aerated lung. Paucity of bowel gas.  No acute osseous abnormality identified. IMPRESSION: Low lung volumes with bibasilar hypoventilation. Follow-up PA and lateral views of the chest would be helpful when feasible. Electronically Signed   By: Odessa Fleming M.D.   On: 04/17/2023 09:00        Scheduled Meds: Continuous Infusions:  sodium chloride 100 mL/hr at 04/18/23 1003   azithromycin     cefTRIAXone (ROCEPHIN)  IV 2 g (04/18/23 1038)   heparin 1,300 Units/hr (04/18/23 0531)     LOS: 1 day    Time spent: 45 minutes spent on chart review, discussion with nursing staff, consultants, updating family and interview/physical exam; more than 50% of that  time was spent in counseling and/or coordination of care.    Joseph Art, DO [facility] Available via Epic secure chat 7am-7pm After these hours, please refer to coverage provider listed on [email or web address] 04/18/2023, 11:33 AM

## 2023-04-18 NOTE — Telephone Encounter (Signed)
 Patient Product/process development scientist completed.    The patient is insured through [facility]. Patient has Medicare and is not eligible for a copay card, but may be able to apply for patient assistance or Medicare RX Payment Plan (Patient Must reach out to their plan, if eligible for payment plan), if available.    Ran test claim for Eliquis 5 mg and the current 30 day co-pay is $367.85 due to a deductible.  Will be $45.00 once deductible is met.  Ran test claim for Xarelto 20 mg and the current 30 day co-pay is $367.85 due to a deductible.  Will be $45.00 once deductible is met.   This test claim was processed through [facility]- copay amounts may vary at other pharmacies due to pharmacy/plan contracts, or as the patient moves through the different stages of their insurance plan.     Roland Earl, CPHT Pharmacy Technician III Certified Patient Advocate [facility] Patient Advocate Team Direct Number: [phone]  Fax: [phone]

## 2023-04-19 DIAGNOSIS — I2699 Other pulmonary embolism without acute cor pulmonale: Secondary | ICD-10-CM | POA: Diagnosis not present

## 2023-04-19 LAB — CBC
HCT: 35.3 % — ABNORMAL LOW (ref 36.0–46.0)
Hemoglobin: 11 g/dL — ABNORMAL LOW (ref 12.0–15.0)
MCH: 30.9 pg (ref 26.0–34.0)
MCHC: 31.2 g/dL (ref 30.0–36.0)
MCV: 99.2 fL (ref 80.0–100.0)
Platelets: 218 10*3/uL (ref 150–400)
RBC: 3.56 MIL/uL — ABNORMAL LOW (ref 3.87–5.11)
RDW: 13.2 % (ref 11.5–15.5)
WBC: 11.3 10*3/uL — ABNORMAL HIGH (ref 4.0–10.5)
nRBC: 0 % (ref 0.0–0.2)

## 2023-04-19 LAB — BASIC METABOLIC PANEL WITH GFR
Anion gap: 10 (ref 5–15)
BUN: 22 mg/dL (ref 8–23)
CO2: 20 mmol/L — ABNORMAL LOW (ref 22–32)
Calcium: 8.4 mg/dL — ABNORMAL LOW (ref 8.9–10.3)
Chloride: 102 mmol/L (ref 98–111)
Creatinine, Ser: 0.91 mg/dL (ref 0.44–1.00)
GFR, Estimated: >60 mL/min (ref >60)
Glucose, Bld: 121 mg/dL — ABNORMAL HIGH (ref 70–99)
Potassium: 4.3 mmol/L (ref 3.5–5.1)
Sodium: 132 mmol/L — ABNORMAL LOW (ref 135–145)

## 2023-04-19 LAB — HEPARIN LEVEL (UNFRACTIONATED)
Heparin Unfractionated: 0.34 [IU]/mL (ref 0.30–0.70)
Heparin Unfractionated: 0.22 [IU]/mL — ABNORMAL LOW (ref 0.30–0.70)
Heparin Unfractionated: 0.36 [IU]/mL (ref 0.30–0.70)

## 2023-04-19 MED ORDER — KETOROLAC TROMETHAMINE 30 MG/ML IJ SOLN: 15.0000 mg | Freq: Four times a day (QID) | INTRAMUSCULAR | Status: DC

## 2023-04-19 MED ORDER — HEPARIN BOLUS VIA INFUSION
1200.0000 [IU] | Freq: Once | INTRAVENOUS | Status: AC
  Administered 2023-04-19: 1200 [IU] via INTRAVENOUS
  Filled 2023-04-19: qty 1200

## 2023-04-19 MED ORDER — SODIUM CHLORIDE 0.9 % IV SOLN: INTRAVENOUS | Status: DC

## 2023-04-19 MED ORDER — KETOROLAC TROMETHAMINE 30 MG/ML IJ SOLN
15.0000 mg | Freq: Four times a day (QID) | INTRAMUSCULAR | Status: AC
  Administered 2023-04-19 – 2023-04-21 (×10): 15 mg via INTRAVENOUS
  Filled 2023-04-19 (×9): qty 1

## 2023-04-19 NOTE — Plan of Care (Signed)

## 2023-04-19 NOTE — TOC CM/SW Note (Signed)
 Transition of Care [facility]) - Inpatient Brief Assessment   Patient Details  Name: Shannon Henderson MRN: [id number] Date of Birth: 1955/12/01  Transition of Care [facility]) CM/SW Contact:    Larrie Kass, LCSW Phone Number: 04/19/2023, 12:07 PM      Transition of Care Asessment: Insurance and Status: Insurance coverage has been reviewed Patient has primary care physician: Yes Home environment has been reviewed: home with self Prior level of function:: mod independent Prior/Current Home Services: No current home services Social Drivers of Health Review: SDOH reviewed no interventions necessary Readmission risk has been reviewed: Yes Transition of care needs: no transition of care needs at this time

## 2023-04-19 NOTE — Progress Notes (Signed)
 Brief Pharmacy Anti-coagulation Note:  For full details see note from [facility]  A/P: HL 0.36 therapeutic on 1450 units/hr No complications of therapy noted Check heparin level with am labs  Arley Phenix RPh 04/19/2023, 11:02 PM

## 2023-04-19 NOTE — Progress Notes (Signed)
 PROGRESS NOTE    Shannon Henderson  BJY:[id number] DOB: 1955/03/28 DOA: 04/17/2023 PCP: System, Provider Not In    Brief Narrative:  Shannon Henderson is a 68 y.o. 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  History is provided by the patient as well as her husband who is at the bedside, they state that she has had a chronic nonproductive cough for the last month or 2, without sputum production, chest pain, fevers or chills.  This morning, she had sudden onset of severe low back pain, worse with inspiration.  Workup as detailed below shows evidence of acute bilateral and subsegmental PE.  On further discussion, patient states she just returned back about a week ago from driving down to [place] and back.  She denies any lower extremity swelling or pain.    Assessment and Plan: Acute bilateral PE-without evidence of heart strain, though she does have some mild tachycardia and asymptomatic hypotension.  Initially she was hypertensive, hypotension may be related to narcotics which she received.  I suspect this is provoked by her recent long drive to [place] and back.  Patient and wife state that she has had appropriate cancer screening including mammograms and colonoscopy.  Denies any recent worrisome symptoms such as weight loss, chronic pain, nausea etc. -Initiate IV heparin drip- change to PO once able to tolerate PO -echo- done with grade 2 diastolic -lower extremity Dopplers negative -Pain control with p.o. and IV narcotics -added abx and IVF  Nausea -IVF -PRN anti-emetics    DVT prophylaxis:     Code Status: Full Code Family Communication: at bedside  Disposition Plan:  Level of care: Progressive Status is: Inpatient     Consultants:  none   Subjective: Feeling a little better but still with  pain   Objective: Vitals:   04/18/23 1238 04/18/23 1333 04/18/23 2014 04/19/23 0404  BP: (!) 94/45 (!) 107/56 (!) 153/66 134/71  Pulse: 90  (!) 107 97  Resp: 14  20 19   Temp: 98.6 F (37 C)  98.6 F (37 C) 98.6 F (37 C)  TempSrc: Oral     SpO2: 98%  96% 98%  Weight:      Height:       No intake or output data in the 24 hours ending 04/19/23 1044  Filed Weights   04/17/23 0640  Weight: 78.5 kg    Examination:   General: Appearance:    Well developed, well nourished female in no acute distress     Lungs:     respirations unlabored  Heart:    Normal heart rate.    MS:   All extremities are intact.    Neurologic:   Awake, alert       Data Reviewed: I have personally reviewed following labs and imaging studies  CBC: Recent Labs  Lab 04/17/23 0711 04/18/23 0456 04/19/23 0409  WBC 9.7 14.8* 11.3*  NEUTROABS 6.9  --   --   HGB 12.6 12.0 11.0*  HCT 40.2 37.8 35.3*  MCV 97.3 97.9 99.2  PLT 241 236 218   Basic Metabolic Panel: Recent Labs  Lab 04/17/23 0711 04/18/23 0456 04/19/23 0409  NA 136 134* 132*  K 4.1 4.5 4.3  CL 102 100 102  CO2 26 22 20*  GLUCOSE 110* 80 121*  BUN 19 17 22   CREATININE 0.64  0.70 0.91  CALCIUM 8.9 9.0 8.4*   GFR: Estimated Creatinine Clearance: 71.4 mL/min (by C-G formula based on SCr of 0.91 mg/dL). Liver Function Tests: Recent Labs  Lab 04/17/23 0711  AST 18  ALT 35  ALKPHOS 21*  BILITOT 2.0*  PROT 6.4*  ALBUMIN 3.2*   No results for input(s): "LIPASE", "AMYLASE" in the last 168 hours. No results for input(s): "AMMONIA" in the last 168 hours. Coagulation Profile: No results for input(s): "INR", "PROTIME" in the last 168 hours. Cardiac Enzymes: No results for input(s): "CKTOTAL", "CKMB", "CKMBINDEX", "TROPONINI" in the last 168 hours. BNP (last 3 results) No results for input(s): "PROBNP" in the last 8760 hours. HbA1C: No results for input(s): "HGBA1C" in the last 72 hours. CBG: No results for input(s):  "GLUCAP" in the last 168 hours. Lipid Profile: No results for input(s): "CHOL", "HDL", "LDLCALC", "TRIG", "CHOLHDL", "LDLDIRECT" in the last 72 hours. Thyroid Function Tests: No results for input(s): "TSH", "T4TOTAL", "FREET4", "T3FREE", "THYROIDAB" in the last 72 hours. Anemia Panel: No results for input(s): "VITAMINB12", "FOLATE", "FERRITIN", "TIBC", "IRON", "RETICCTPCT" in the last 72 hours. Sepsis Labs: No results for input(s): "PROCALCITON", "LATICACIDVEN" in the last 168 hours.  No results found for this or any previous visit (from the past 240 hours).       Radiology Studies: VAS [place] LOWER EXTREMITY VENOUS (DVT) Result Date: 04/18/2023  Lower Venous DVT Study Patient Name:  Shannon Henderson  Date of Exam:   04/18/2023 Medical Rec #: [id number]         Accession #:    [id number] Date of Birth: 08/26/1955          Patient Gender: F Patient Age:   31 years Exam Location:  [facility] Procedure:      VAS [place] LOWER EXTREMITY VENOUS (DVT) Referring Phys: MIR [facility] --------------------------------------------------------------------------------  Indications: Pulmonary embolism.  Risk Factors: Recent extended travel. Comparison Study: No previous exams Performing Technologist: Jody Hill RVT, RDMS  Examination Guidelines: A complete evaluation includes B-mode imaging, spectral Doppler, color Doppler, and power Doppler as needed of all accessible portions of each vessel. Bilateral testing is considered an integral part of a complete examination. Limited examinations for reoccurring indications may be performed as noted. The reflux portion of the exam is performed with the patient in reverse Trendelenburg.  +-----+---------------+---------+-----------+----------+--------------+ RIGHTCompressibilityPhasicitySpontaneityPropertiesThrombus Aging +-----+---------------+---------+-----------+----------+--------------+ CFV  Full           Yes      Yes                                  +-----+---------------+---------+-----------+----------+--------------+   +---------+---------------+---------+-----------+----------+--------------+ LEFT     CompressibilityPhasicitySpontaneityPropertiesThrombus Aging +---------+---------------+---------+-----------+----------+--------------+ CFV      Full           Yes      Yes                                 +---------+---------------+---------+-----------+----------+--------------+ SFJ      Full                                                        +---------+---------------+---------+-----------+----------+--------------+ FV Prox  Full  Yes      Yes                                 +---------+---------------+---------+-----------+----------+--------------+ FV Mid   Full           Yes      Yes                                 +---------+---------------+---------+-----------+----------+--------------+ FV DistalFull           Yes      Yes                                 +---------+---------------+---------+-----------+----------+--------------+ PFV      Full                                                        +---------+---------------+---------+-----------+----------+--------------+ POP      Full           Yes      Yes                                 +---------+---------------+---------+-----------+----------+--------------+ PTV      Full                                                        +---------+---------------+---------+-----------+----------+--------------+ PERO     Full                                                        +---------+---------------+---------+-----------+----------+--------------+    Summary: RIGHT: - No evidence of common femoral vein obstruction.   LEFT: - There is no evidence of deep vein thrombosis in the lower extremity.  - No cystic structure found in the popliteal fossa.  *See table(s) above for measurements and observations. Electronically signed  by Coral Else MD on 04/18/2023 at 6:58:38 PM.    Final    ECHOCARDIOGRAM COMPLETE Result Date: 04/17/2023    ECHOCARDIOGRAM REPORT   Patient Name:   Shannon Henderson Date of Exam: 04/17/2023 Medical Rec #:  [id number]        Height:       73.0 in Accession #:    [id number]       Weight:       173.0 lb Date of Birth:  [birth year]         BSA:          2.023 m Patient Age:    68 years         BP:           99/73 mmHg Patient Gender: F                HR:  91 bpm. Exam Location:  Inpatient Procedure: 2D Echo, Cardiac Doppler and Color Doppler (Both Spectral and Color            Flow Doppler were utilized during procedure). Indications:    Pulmonary embolus  History:        Patient has no prior history of Echocardiogram examinations.  Sonographer:    Vern Claude Referring Phys: [id number] MIR M [facility] IMPRESSIONS  1. Left ventricular ejection fraction, by estimation, is 70 to 75%. The left ventricle has hyperdynamic function. The left ventricle has no regional wall motion abnormalities. Left ventricular diastolic parameters are consistent with Grade II diastolic dysfunction (pseudonormalization).  2. Right ventricular systolic function is normal. The right ventricular size is normal. There is normal pulmonary artery systolic pressure.  3. The mitral valve is normal in structure. Trivial mitral valve regurgitation. No evidence of mitral stenosis.  4. The aortic valve is normal in structure. Aortic valve regurgitation is not visualized. No aortic stenosis is present.  5. The inferior vena cava is normal in size with greater than 50% respiratory variability, suggesting right atrial pressure of 3 mmHg. FINDINGS  Left Ventricle: Left ventricular ejection fraction, by estimation, is 70 to 75%. The left ventricle has hyperdynamic function. The left ventricle has no regional wall motion abnormalities. The left ventricular internal cavity size was normal in size. There is no left ventricular hypertrophy. Left ventricular  diastolic parameters are consistent with Grade II diastolic dysfunction (pseudonormalization). Right Ventricle: The right ventricular size is normal. No increase in right ventricular wall thickness. Right ventricular systolic function is normal. There is normal pulmonary artery systolic pressure. The tricuspid regurgitant velocity is 1.47 m/s, and  with an assumed right atrial pressure of 3 mmHg, the estimated right ventricular systolic pressure is 11.6 mmHg. Left Atrium: Left atrial size was normal in size. Right Atrium: Right atrial size was normal in size. Pericardium: There is no evidence of pericardial effusion. Mitral Valve: The mitral valve is normal in structure. Trivial mitral valve regurgitation. No evidence of mitral valve stenosis. MV peak gradient, 3.8 mmHg. The mean mitral valve gradient is 2.0 mmHg. Tricuspid Valve: The tricuspid valve is normal in structure. Tricuspid valve regurgitation is trivial. No evidence of tricuspid stenosis. Aortic Valve: The aortic valve is normal in structure. Aortic valve regurgitation is not visualized. No aortic stenosis is present. Aortic valve mean gradient measures 5.0 mmHg. Aortic valve peak gradient measures 8.6 mmHg. Aortic valve area, by VTI measures 2.70 cm. Pulmonic Valve: The pulmonic valve was grossly normal. Pulmonic valve regurgitation is not visualized. No evidence of pulmonic stenosis. Aorta: The aortic root is normal in size and structure. Venous: The inferior vena cava is normal in size with greater than 50% respiratory variability, suggesting right atrial pressure of 3 mmHg. IAS/Shunts: No atrial level shunt detected by color flow Doppler.  LEFT VENTRICLE PLAX 2D LVIDd:         3.60 cm      Diastology LVIDs:         2.50 cm      LV e' medial:    7.51 cm/s LV PW:         0.70 cm      LV E/e' medial:  13.2 LV IVS:        0.70 cm      LV e' lateral:   8.70 cm/s LVOT diam:     1.80 cm      LV E/e' lateral: 11.4 LV SV:  63 LV SV Index:   31 LVOT  Area:     2.54 cm  LV Volumes (MOD) LV vol d, MOD A2C: 93.6 ml LV vol d, MOD A4C: 109.0 ml LV vol s, MOD A2C: 24.9 ml LV vol s, MOD A4C: 29.5 ml LV SV MOD A2C:     68.7 ml LV SV MOD A4C:     109.0 ml LV SV MOD BP:      75.2 ml RIGHT VENTRICLE             IVC RV Basal diam:  3.10 cm     IVC diam: 1.00 cm RV Mid diam:    3.10 cm RV S prime:     28.10 cm/s TAPSE (M-mode): 2.4 cm LEFT ATRIUM             Index        RIGHT ATRIUM           Index LA diam:        2.40 cm 1.19 cm/m   RA Area:     11.00 cm LA Vol (A2C):   30.9 ml 15.27 ml/m  RA Volume:   21.60 ml  10.68 ml/m LA Vol (A4C):   23.8 ml 11.76 ml/m LA Biplane Vol: 29.6 ml 14.63 ml/m  AORTIC VALVE                     PULMONIC VALVE AV Area (Vmax):    2.42 cm      PV Vmax:       0.95 m/s AV Area (Vmean):   2.68 cm      PV Peak grad:  3.6 mmHg AV Area (VTI):     2.70 cm AV Vmax:           147.00 cm/s AV Vmean:          94.900 cm/s AV VTI:            0.232 m AV Peak Grad:      8.6 mmHg AV Mean Grad:      5.0 mmHg LVOT Vmax:         140.00 cm/s LVOT Vmean:        100.000 cm/s LVOT VTI:          0.246 m LVOT/AV VTI ratio: 1.06  AORTA Ao Root diam: 3.60 cm Ao Asc diam:  2.50 cm MITRAL VALVE               TRICUSPID VALVE MV Area (PHT): 3.19 cm    TR Peak grad:   8.6 mmHg MV Area VTI:   2.66 cm    TR Vmax:        147.00 cm/s MV Peak grad:  3.8 mmHg MV Mean grad:  2.0 mmHg    SHUNTS MV Vmax:       0.97 m/s    Systemic VTI:  0.25 m MV Vmean:      68.9 cm/s   Systemic Diam: 1.80 cm MV Decel Time: 238 msec MV E velocity: 99.20 cm/s MV A velocity: 75.70 cm/s MV E/A ratio:  1.31 Arvilla Meres MD Electronically signed by Arvilla Meres MD Signature Date/Time: 04/17/2023/2:57:57 PM    Final         Scheduled Meds:  ketorolac  15 mg Intravenous Q6H   Continuous Infusions:  azithromycin 500 mg (04/18/23 1300)   cefTRIAXone (ROCEPHIN)  IV 2 g (04/18/23 1038)   heparin 1,300 Units/hr (04/19/23 0045)  LOS: 2 days    Time spent: 45 minutes spent on  chart review, discussion with nursing staff, consultants, updating family and interview/physical exam; more than 50% of that time was spent in counseling and/or coordination of care.    Joseph Art, DO [facility] Available via Epic secure chat 7am-7pm After these hours, please refer to coverage provider listed on [email or web address] 04/19/2023, 10:44 AM

## 2023-04-19 NOTE — Progress Notes (Addendum)
 PHARMACY - ANTICOAGULATION CONSULT NOTE  Pharmacy Consult for heparin Indication: pulmonary embolus  No Known Allergies  Patient Measurements: Height: 6\' 1"  (185.4 cm) Weight: 78.5 kg (173 lb) IBW/kg (Calculated) : 75.4 HEPARIN DW (KG): 78.5  Vital Signs: Temp: 98.6 F (37 C) (04/03 1025) Temp Source: Oral (04/03 1025) BP: 129/61 (04/03 1025) Pulse Rate: 84 (04/03 1025)  Labs: Recent Labs    04/17/23 0711 04/17/23 1043 04/17/23 1737 04/17/23 2359 04/18/23 0456 04/19/23 0409  HGB 12.6  --   --   --  12.0 11.0*  HCT 40.2  --   --   --  37.8 35.3*  PLT 241  --   --   --  236 218  HEPARINUNFRC  --   --    < > 0.66 0.65 0.34  CREATININE 0.64  --   --   --  0.70 0.91  TROPONINIHS 6 8  --   --   --   --    < > = values in this interval not displayed.    Estimated Creatinine Clearance: 71.4 mL/min (by C-G formula based on SCr of 0.91 mg/dL).   Medical History: Past Medical History:  Diagnosis Date   Amputee, below knee, right (HCC)    Fracture of multiple ribs 04/19/2017   L ribs 5-10 closed fractures   Pneumothorax on left 04/19/2017   s/p fall from horse    Medications: No anticoagulants PTA  Assessment: 3 yoF presenting with cough, chest pain. CTA revealed bilateral PE, no evidence of RHS. Pharmacy consulted to dose heparin.   Today, 04/19/23 Heparin level = 0.34 remains therapeutic on heparin infusion of 1300 units/hr.  Has trended down to lower end of therapeutic range CBC: Hgb slightly decreased, Plt WNL No bleeding reported  Goal of Therapy:  Heparin level 0.3-0.7 units/ml Monitor platelets by anticoagulation protocol: Yes   Plan:  Continue heparin at 1300 units/hr Check confirmatory level this afternoon CBC, heparin level daily Monitor for bleeding  Cindi Carbon, PharmD 04/19/23 11:52 AM  Addendum: Afternoon Follow Up  Assessment: Heparin level = 0.22 is now subtherapeutic on heparin infusion of 1300 units/hr No  pauses/interruptions/complications  Plan:  Heparin bolus of 1200 units IV once Increase rate of heparin infusion to 1450 units/hr Check heparin level 6-8 hours after rate change  Cindi Carbon, PharmD 04/19/23 2:59 PM

## 2023-04-20 DIAGNOSIS — R918 Other nonspecific abnormal finding of lung field: Secondary | ICD-10-CM | POA: Diagnosis not present

## 2023-04-20 DIAGNOSIS — R053 Chronic cough: Secondary | ICD-10-CM | POA: Diagnosis not present

## 2023-04-20 DIAGNOSIS — I2699 Other pulmonary embolism without acute cor pulmonale: Secondary | ICD-10-CM | POA: Diagnosis not present

## 2023-04-20 LAB — BASIC METABOLIC PANEL WITH GFR
Anion gap: 10 (ref 5–15)
BUN: 24 mg/dL — ABNORMAL HIGH (ref 8–23)
CO2: 21 mmol/L — ABNORMAL LOW (ref 22–32)
Calcium: 8.6 mg/dL — ABNORMAL LOW (ref 8.9–10.3)
Chloride: 105 mmol/L (ref 98–111)
Creatinine, Ser: 0.47 mg/dL (ref 0.44–1.00)
GFR, Estimated: >60 mL/min (ref >60)
Glucose, Bld: 94 mg/dL (ref 70–99)
Potassium: 4 mmol/L (ref 3.5–5.1)
Sodium: 136 mmol/L (ref 135–145)

## 2023-04-20 LAB — CBC
HCT: 32.3 % — ABNORMAL LOW (ref 36.0–46.0)
Hemoglobin: 9.9 g/dL — ABNORMAL LOW (ref 12.0–15.0)
MCH: 30.5 pg (ref 26.0–34.0)
MCHC: 30.7 g/dL (ref 30.0–36.0)
MCV: 99.4 fL (ref 80.0–100.0)
Platelets: 225 10*3/uL (ref 150–400)
RBC: 3.25 MIL/uL — ABNORMAL LOW (ref 3.87–5.11)
RDW: 13.1 % (ref 11.5–15.5)
WBC: 8 10*3/uL (ref 4.0–10.5)
nRBC: 0 % (ref 0.0–0.2)

## 2023-04-20 LAB — HEPARIN LEVEL (UNFRACTIONATED)
Heparin Unfractionated: 0.27 [IU]/mL — ABNORMAL LOW (ref 0.30–0.70)
Heparin Unfractionated: 0.42 [IU]/mL (ref 0.30–0.70)

## 2023-04-20 LAB — BRAIN NATRIURETIC PEPTIDE: B Natriuretic Peptide: 307 pg/mL — ABNORMAL HIGH (ref 0.0–100.0)

## 2023-04-20 LAB — SARS CORONAVIRUS 2 BY RT PCR: SARS Coronavirus 2 by RT PCR: NEGATIVE

## 2023-04-20 MED ORDER — PANTOPRAZOLE SODIUM 40 MG IV SOLR
40.0000 mg | Freq: Every day | INTRAVENOUS | Status: DC
  Administered 2023-04-20 – 2023-04-23 (×4): 40 mg via INTRAVENOUS
  Filled 2023-04-20 (×4): qty 10

## 2023-04-20 MED ORDER — HEPARIN BOLUS VIA INFUSION
1200.0000 [IU] | Freq: Once | INTRAVENOUS | Status: AC
  Administered 2023-04-20: 1200 [IU] via INTRAVENOUS
  Filled 2023-04-20: qty 1200

## 2023-04-20 MED ORDER — ENSURE ENLIVE PO LIQD
237.0000 mL | Freq: Two times a day (BID) | ORAL | Status: DC
  Administered 2023-04-23 (×2): 237 mL via ORAL

## 2023-04-20 MED ORDER — PANTOPRAZOLE SODIUM 40 MG IV SOLR: 40.0000 mg | INTRAVENOUS | Status: DC

## 2023-04-20 NOTE — Plan of Care (Signed)

## 2023-04-20 NOTE — Progress Notes (Signed)
 PHARMACY - ANTICOAGULATION CONSULT NOTE  Pharmacy Consult for Heparin Indication: pulmonary embolus  No Known Allergies  Patient Measurements: Height: 6\' 1"  (185.4 cm) Weight: 78.5 kg (173 lb) IBW/kg (Calculated) : 75.4 HEPARIN DW (KG): 78.5  Vital Signs: Temp: 98.6 F (37 C) (04/04 1232) Temp Source: Oral (04/04 1232) BP: 121/57 (04/04 1232) Pulse Rate: 88 (04/04 0515)  Labs: Recent Labs    04/18/23 0456 04/19/23 0409 04/19/23 1350 04/19/23 2200 04/20/23 0359 04/20/23 1144  HGB 12.0 11.0*  --   --  9.9*  --   HCT 37.8 35.3*  --   --  32.3*  --   PLT 236 218  --   --  225  --   HEPARINUNFRC 0.65 0.34   < > 0.36 0.27* 0.42  CREATININE 0.70 0.91  --   --  0.47  --    < > = values in this interval not displayed.    Estimated Creatinine Clearance: 81.2 mL/min (by C-G formula based on SCr of 0.47 mg/dL).   Medical History: Past Medical History:  Diagnosis Date   Amputee, below knee, right (HCC)    Fracture of multiple ribs 04/19/2017   L ribs 5-10 closed fractures   Pneumothorax on left 04/19/2017   s/p fall from horse    Assessment:  AC/Heme: UFH for bilateral PE, no RHS -Likely provoked from long car ride -L Doppler: Negative -Hgb down 12.6>9.9, HL 0.42   Goal of Therapy:  Heparin level 0.3-0.7 units/ml Monitor platelets by anticoagulation protocol: Yes   Plan:  IV heparin 1600 units/hr. Daily HL and CBC   Esmay Amspacher S. Merilynn Finland, PharmD, BCPS Clinical Staff Pharmacist Misty Stanley Stillinger 04/20/2023,1:09 PM

## 2023-04-20 NOTE — Progress Notes (Signed)
 Mobility Specialist - Progress Note  ([place] 2L) Pre-mobility: 93 bpm HR, 99% SpO2 Post-mobility: 93 bpm HR, 100% SPO2   04/20/23 1444  Mobility  Activity Transferred from bed to chair  Level of Assistance Minimal assist, patient does 75% or more  Assistive Device None  Range of Motion/Exercises Active Assistive  Activity Response Tolerated fair  Mobility Referral Yes  Mobility visit 1 Mobility  Mobility Specialist Start Time (ACUTE ONLY) 1435  Mobility Specialist Stop Time (ACUTE ONLY) 1444  Mobility Specialist Time Calculation (min) (ACUTE ONLY) 9 min   Pt was found in bed and agreeable to mobilize. Bed elevated for STS due to x2 failed STS attempts prior. Was left on recliner chair with all needs met. Call bell in reach and RN notified.

## 2023-04-20 NOTE — Consult Note (Signed)
 NAME:  Shannon Henderson, MRN:  [id number], DOB:  11/15/1955, LOS: 3 ADMISSION DATE:  04/17/2023, CONSULTATION DATE:  04/20/23 REFERRING MD:  Dr. 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  Echo revealed hyperdynamic EF 70-75%, G2DD, normal RV and normal PASP.  LE dopplers negative for DVT.  SARS/ flu/ RSV neg.  BNP 98, neg troponin hs.  Initial tmax 101.7 with WBC 14.8, empiric CAP coverage started.  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  Recently returned from [place] on 3/18 and remains very active, played pickleball as most recently of 3/31.  Never smoker.  No family hx of clotting disorders, no LE pain/ swelling, no dizziness/ syncope, and reports is up to date on cancer screenings, no unintentional weight loss.  Treated with heparin gtt however pt remains largely symptomatic with fatigue and exertional dyspnea requiring 2 person assist to [facility] and requiring 2L O2.  Additionally, patient has been seen outpatient recently for development of chronic cough since November.  Reports hx of snoring, no prior sleep study.  Started wearing a snore guard in October.  Has helped with her snoring but since has had ongoing mostly dry clear cough and poor sleep due to coughing spells.  Previous concerns for sinus infection with nasal congestion and sore throat over several weeks, treated with abx and steroids but no significant improvement per patient.   Denies reflux symptoms.  Given lack of improvement with treatment of PE with ongoing dry cough, fatigue and SOB, PCCM consulted for further input.   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 rate 20, height 6\' 1"  (1.854 m), weight 78.5 kg, SpO2 100%.        Intake/Output Summary (Last 24 hours) at 04/20/2023 1324 Last data filed at 04/20/2023 0400 Gross per 24 hour  Intake 1957.77 ml  Output --  Net 1957.77 ml   Filed Weights   04/17/23 0640  Weight: 78.5 kg   Examination: General:  Older adult female sitting in bed in NAD HEENT: MM pink/moist Neuro: AO, appropriate  CV: rr, no murmur PULM:  non labored at rest, clear, diminished in R base posterior with dry cough with deep inspiration Extremities: warm/dry, R BKA, no left tibial edema  Resolved Hospital Problem list    Assessment & Plan:   Acute bilateral segmental/ subsegmental PE  - no evidence of R heart strain, normal PASP, G2DD.  LE dopplers for DVT, normal BNP P:  - suspected provoked 2/2 to recent travel  - heparin gtt per primary, plans to transition to DOAC, would continue for 3 months and consider outpt hypercoagulable workup up  - cont to wean supplemental O2 as able, goal > 92%   Chronic cough with bilateral lower lobe patchy GGO's, r/o aspiration vs CAP Probable OSA  SIRS Hiatal hernia  - CT chest showing patchy GGO bilateral lower lobes, no mediastinal or hilar lymphadenopathy but noted to have patulous esophagus and moderate hiatal hernia.  Cough developed shortly after changing to a snore guard which could exacerbate reflux in this setting leading to chronic aspiration.  Pt does eat and go to bed afterwards could be contributing as well. P:  - initial leukocytosis, fever could be related to PE but cant rule out aspiration/ CAP  either.  Since normalized and remains afebrile despite day 3/x of abx but remains quite symptomatic  - aspiration precautions, discussed with pt and recommended to avoid eating 3-4 hrs prior to bedtime and sleeping elevated HOB - SLP eval, consider outpt GI workup pending eval - consider short course of PPI  - would recommend for now to hold off on snore guard as this seems to exacerbate her symptoms but does reportedly help with her sleep quality, needs outpt sleep study  - would also benefit from PFTs outpt - check HSP panel given environmental exposures   Remainder per primary team.  PCCM will continue to follow   Best Practice (right click and "Reselect all SmartList Selections" daily)  Per primary team  Labs   CBC: Recent Labs  Lab 04/17/23 0711 04/18/23 0456 04/19/23 0409 04/20/23 0359  WBC 9.7 14.8* 11.3* 8.0  NEUTROABS 6.9  --   --   --   HGB 12.6 12.0 11.0* 9.9*  HCT 40.2 37.8 35.3* 32.3*  MCV 97.3 97.9 99.2 99.4  PLT 241 236 218 225    Basic Metabolic Panel: Recent Labs  Lab 04/17/23 0711 04/18/23 0456 04/19/23 0409 04/20/23 0359  NA 136 134* 132* 136  K 4.1 4.5 4.3 4.0  CL 102 100 102 105  CO2 26 22 20* 21*  GLUCOSE 110* 80 121* 94  BUN 19 17 22  24*  CREATININE 0.64 0.70 0.91 0.47  CALCIUM 8.9 9.0 8.4* 8.6*   GFR: Estimated Creatinine Clearance: 81.2 mL/min (by C-G formula based on SCr of 0.47 mg/dL). Recent Labs  Lab 04/17/23 0711 04/18/23 0456 04/19/23 0409 04/20/23 0359  WBC 9.7 14.8* 11.3* 8.0    Liver Function Tests: Recent Labs  Lab 04/17/23 0711  AST 18  ALT 35  ALKPHOS 21*  BILITOT 2.0*  PROT 6.4*  ALBUMIN 3.2*   No results for input(s): "LIPASE", "AMYLASE" in the last 168 hours. No results for input(s): "AMMONIA" in the last 168 hours.  ABG    Component Value Date/Time   TCO2 26 04/19/2017 1341     Coagulation Profile: No results for input(s): "INR", "PROTIME" in the last 168 hours.  Cardiac Enzymes: No results for  input(s): "CKTOTAL", "CKMB", "CKMBINDEX", "TROPONINI" in the last 168 hours.  HbA1C: No results found for: "HGBA1C"  CBG: No results for input(s): "GLUCAP" in the last 168 hours.  Review of Systems:   Review of Systems  Constitutional:  Positive for fever and malaise/fatigue. Negative for chills and weight loss.  HENT:  Positive for sore throat.   Respiratory:  Positive for cough and shortness of breath. Negative for sputum production.   Cardiovascular:  Negative for chest pain and leg swelling.  Neurological:  Negative for dizziness and focal weakness.   Past Medical History:  She,  has a past medical history of Amputee, below knee, right (HCC), Fracture of multiple ribs (04/19/2017), and Pneumothorax on left (04/19/2017).   Surgical History:  History reviewed. No pertinent surgical history.   Social History:   reports that  she has never smoked. She has never used smokeless tobacco. She reports that she does not drink alcohol and does not use drugs.   Family History:  Her family history includes Breast cancer (age of onset: 53) in her mother.   Allergies No Known Allergies   Home Medications  Prior to Admission medications   Medication Sig Start Date End Date Taking? Authorizing Provider  acetaminophen-codeine (TYLENOL #3) 300-30 MG tablet Take 1 tablet by mouth every 4 (four) hours as needed for moderate pain (pain score 4-6) or severe pain (pain score 7-10). 08/02/21  Yes [provider]  b complex vitamins tablet Take 1 tablet by mouth daily.   Yes [provider]  CALCIUM PO Take 250 mg by mouth 2 (two) times daily.   Yes [provider]  fluticasone (FLONASE) 50 MCG/ACT nasal spray Place 1 spray into both nostrils daily as needed for rhinitis or allergies. 04/09/23 04/08/24 Yes [provider]  Magnesium Chloride (MAGNESIUM DR PO) Take 1 capsule by mouth daily at 12 noon.   Yes [provider]  mirtazapine (REMERON) 15 MG tablet  Take 15 mg by mouth at bedtime. 03/17/17  Yes [provider]  Multiple Vitamin (MULTIVITAMIN) capsule Take 1 capsule by mouth daily.   Yes [provider]  Omega-3 Fatty Acids (COROMEGA OMEGA 3 SQUEEZE) EMUL Take 1 capsule by mouth daily.   Yes [provider]  oxyCODONE (OXY IR/ROXICODONE) 5 MG immediate release tablet Take 1 tablet (5 mg total) by mouth every 6 (six) hours as needed for severe pain. 04/21/17  Yes Meuth, Brooke A, PA-C  SUMAtriptan (IMITREX) 100 MG tablet Take 100 mg by mouth every 2 (two) hours as needed for headache or migraine.   Yes [provider]  ibandronate (BONIVA) 150 MG tablet Take 150 mg by mouth every 30 (thirty) days.    [provider]     Critical care time: n/a     Posey Boyer, MSN, AG-ACNP-BC [facility] 04/20/2023, 1:24 PM  See Amion for pager If no response to pager , please call [phone] until 7pm After 7:00 pm call Elink  [phone]

## 2023-04-20 NOTE — Progress Notes (Signed)
 PHARMACY - ANTICOAGULATION CONSULT NOTE  Pharmacy Consult for heparin Indication: pulmonary embolus  No Known Allergies  Patient Measurements: Height: 6\' 1"  (185.4 cm) Weight: 78.5 kg (173 lb) IBW/kg (Calculated) : 75.4 HEPARIN DW (KG): 78.5  Vital Signs: Temp: 98.7 F (37.1 C) (04/03 2018) Temp Source: Oral (04/03 2018) BP: 120/61 (04/03 2018) Pulse Rate: 94 (04/03 2018)  Labs: Recent Labs    04/17/23 0711 04/17/23 1043 04/17/23 1737 04/18/23 0456 04/19/23 0409 04/19/23 1350 04/19/23 2200 04/20/23 0359  HGB 12.6  --   --  12.0 11.0*  --   --  9.9*  HCT 40.2  --   --  37.8 35.3*  --   --  32.3*  PLT 241  --   --  236 218  --   --  225  HEPARINUNFRC  --   --    < > 0.65 0.34 0.22* 0.36 0.27*  CREATININE 0.64  --   --  0.70 0.91  --   --   --   TROPONINIHS 6 8  --   --   --   --   --   --    < > = values in this interval not displayed.    Estimated Creatinine Clearance: 71.4 mL/min (by C-G formula based on SCr of 0.91 mg/dL).   Medical History: Past Medical History:  Diagnosis Date   Amputee, below knee, right (HCC)    Fracture of multiple ribs 04/19/2017   L ribs 5-10 closed fractures   Pneumothorax on left 04/19/2017   s/p fall from horse    Medications: No anticoagulants PTA  Assessment: 52 yoF presenting with cough, chest pain. CTA revealed bilateral PE, no evidence of RHS. Pharmacy consulted to dose heparin.   Today, 04/20/23 Heparin level = 0.27 subtherapeutic on 1450 units/hr CBC: Hgb down to 9.9 Plt WNL No bleeding reported  Goal of Therapy:  Heparin level 0.3-0.7 units/ml Monitor platelets by anticoagulation protocol: Yes   Plan:  Bolus 1200 units x 1 Increase heparin drip to 1600 units/hr Heparin level in 6 hours Daily CBC  Arley Phenix RPh 04/20/2023, 5:22 AM

## 2023-04-20 NOTE — Progress Notes (Signed)
 PROGRESS NOTE    Shannon Henderson  WUJ:[id number] DOB: 09-27-1955 DOA: 04/17/2023 PCP: System, Provider Not In    Brief Narrative:  Shannon Henderson is a 68 y.o. 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  History is provided by the patient as well as her husband who is at the bedside, they state that she has had a chronic nonproductive cough for the last month or 2, without sputum production, chest pain, fevers or chills.  This morning, she had sudden onset of severe low back pain, worse with inspiration.  Workup as detailed below shows evidence of acute bilateral and subsegmental PE.  On further discussion, patient states she just returned back about a week ago from driving down to [place] and back.  She denies any lower extremity swelling or pain.    Assessment and Plan: Acute bilateral PE-without evidence of heart strain, though she does have some mild tachycardia and asymptomatic hypotension.  Initially she was hypertensive, hypotension may be related to narcotics which she received.  I suspect this is provoked by her recent long drive to [place] and back.  Patient and wife state that she has had appropriate cancer screening including mammograms and colonoscopy.  Denies any recent worrisome symptoms such as weight loss, chronic pain, nausea etc. -Initiate IV heparin drip- change to PO once able to tolerate PO -echo- done with grade 2 diastolic-- check BNP -lower extremity Dopplers negative -Pain control with p.o. and IV narcotics -added abx  for ? PNA   Acute hypoxic resp failure -IS -continue to wean O2  Dry cough X several months -ask for pulm consult with exposure to hay/dust  Nausea -PRN anti-emetics  -resolved   DVT prophylaxis:     Code Status: Full Code Family Communication: at  bedside  Disposition Plan:  Level of care: Progressive Status is: Inpatient     Consultants:  none   Subjective: Continues to have dry cough-- husband says now that has been there for several months   Objective: Vitals:   04/19/23 2018 04/20/23 0515 04/20/23 0928 04/20/23 1232  BP: 120/61 132/62  (!) 121/57  Pulse: 94 88    Resp: 20 18  20   Temp: 98.7 F (37.1 C) 98.4 F (36.9 C)  98.6 F (37 C)  TempSrc: Oral Oral  Oral  SpO2: 90% 98% 97% 100%  Weight:      Height:        Intake/Output Summary (Last 24 hours) at 04/20/2023 1239 Last data filed at 04/20/2023 0400 Gross per 24 hour  Intake 1957.77 ml  Output --  Net 1957.77 ml    Filed Weights   04/17/23 0640  Weight: 78.5 kg    Examination:   General: Appearance:    Well developed, well nourished female in no acute distress     Lungs:     respirations unlabored, on [place], crackles right base  Heart:    Normal heart rate.    MS:   All extremities are intact.    Neurologic:   Awake, alert       Data Reviewed: I have personally reviewed following labs and imaging studies  CBC: Recent Labs  Lab 04/17/23 0711 04/18/23 0456 04/19/23 0409 04/20/23 0359  WBC 9.7 14.8* 11.3* 8.0  NEUTROABS 6.9  --   --   --   HGB 12.6 12.0  11.0* 9.9*  HCT 40.2 37.8 35.3* 32.3*  MCV 97.3 97.9 99.2 99.4  PLT 241 236 218 225   Basic Metabolic Panel: Recent Labs  Lab 04/17/23 0711 04/18/23 0456 04/19/23 0409 04/20/23 0359  NA 136 134* 132* 136  K 4.1 4.5 4.3 4.0  CL 102 100 102 105  CO2 26 22 20* 21*  GLUCOSE 110* 80 121* 94  BUN 19 17 22  24*  CREATININE 0.64 0.70 0.91 0.47  CALCIUM 8.9 9.0 8.4* 8.6*   GFR: Estimated Creatinine Clearance: 81.2 mL/min (by C-G formula based on SCr of 0.47 mg/dL). Liver Function Tests: Recent Labs  Lab 04/17/23 0711  AST 18  ALT 35  ALKPHOS 21*  BILITOT 2.0*  PROT 6.4*  ALBUMIN 3.2*   No results for input(s): "LIPASE", "AMYLASE" in the last 168 hours. No results for  input(s): "AMMONIA" in the last 168 hours. Coagulation Profile: No results for input(s): "INR", "PROTIME" in the last 168 hours. Cardiac Enzymes: No results for input(s): "CKTOTAL", "CKMB", "CKMBINDEX", "TROPONINI" in the last 168 hours. BNP (last 3 results) No results for input(s): "PROBNP" in the last 8760 hours. HbA1C: No results for input(s): "HGBA1C" in the last 72 hours. CBG: No results for input(s): "GLUCAP" in the last 168 hours. Lipid Profile: No results for input(s): "CHOL", "HDL", "LDLCALC", "TRIG", "CHOLHDL", "LDLDIRECT" in the last 72 hours. Thyroid Function Tests: No results for input(s): "TSH", "T4TOTAL", "FREET4", "T3FREE", "THYROIDAB" in the last 72 hours. Anemia Panel: No results for input(s): "VITAMINB12", "FOLATE", "FERRITIN", "TIBC", "IRON", "RETICCTPCT" in the last 72 hours. Sepsis Labs: No results for input(s): "PROCALCITON", "LATICACIDVEN" in the last 168 hours.  Recent Results (from the past 240 hours)  SARS Coronavirus 2 by RT PCR (hospital order, performed in [facility] hospital lab) *cepheid single result test* Anterior Nasal Swab     Status: None   Collection Time: 04/20/23  9:40 AM   Specimen: Anterior Nasal Swab  Result Value Ref Range Status   SARS Coronavirus 2 by RT PCR NEGATIVE NEGATIVE Final    Comment: (NOTE) SARS-CoV-2 target nucleic acids are NOT DETECTED.  The SARS-CoV-2 RNA is generally detectable in upper and lower respiratory specimens during the acute phase of infection. The lowest concentration of SARS-CoV-2 viral copies this assay can detect is 250 copies / mL. A negative result does not preclude SARS-CoV-2 infection and should not be used as the sole basis for treatment or other patient management decisions.  A negative result may occur with improper specimen collection / handling, submission of specimen other than nasopharyngeal swab, presence of viral mutation(s) within the areas targeted by this assay, and inadequate number of  viral copies (<250 copies / mL). A negative result must be combined with clinical observations, patient history, and epidemiological information.  Fact Sheet for Patients:   RoadLapTop.co.za  Fact Sheet for Healthcare Providers: http://kim-miller.com/  This test is not yet approved or  cleared by the [place] FDA and has been authorized for detection and/or diagnosis of SARS-CoV-2 by FDA under an Emergency Use Authorization (EUA).  This EUA will remain in effect (meaning this test can be used) for the duration of the COVID-19 declaration under Section 564(b)(1) of the Act, 21 U.S.C. section 360bbb-3(b)(1), unless the authorization is terminated or revoked sooner.  Performed at [facility], [address], [place], [place] [address]          Radiology Studies: VAS [place] LOWER EXTREMITY VENOUS (DVT) Result Date: 04/18/2023  Lower Venous DVT Study Patient  Name:  Colbert Ewing  Date of Exam:   04/18/2023 Medical Rec #: [id number]         Accession #:    [id number] Date of Birth: 30-Jul-1955          Patient Gender: F Patient Age:   43 years Exam Location:  [facility] Procedure:      VAS [place] LOWER EXTREMITY VENOUS (DVT) Referring Phys: [facility] --------------------------------------------------------------------------------  Indications: Pulmonary embolism.  Risk Factors: Recent extended travel. Comparison Study: No previous exams Performing Technologist: Jody Hill RVT, RDMS  Examination Guidelines: A complete evaluation includes B-mode imaging, spectral Doppler, color Doppler, and power Doppler as needed of all accessible portions of each vessel. Bilateral testing is considered an integral part of a complete examination. Limited examinations for reoccurring indications may be performed as noted. The reflux portion of the exam is performed with the patient in reverse Trendelenburg.   +-----+---------------+---------+-----------+----------+--------------+ RIGHTCompressibilityPhasicitySpontaneityPropertiesThrombus Aging +-----+---------------+---------+-----------+----------+--------------+ CFV  Full           Yes      Yes                                 +-----+---------------+---------+-----------+----------+--------------+   +---------+---------------+---------+-----------+----------+--------------+ LEFT     CompressibilityPhasicitySpontaneityPropertiesThrombus Aging +---------+---------------+---------+-----------+----------+--------------+ CFV      Full           Yes      Yes                                 +---------+---------------+---------+-----------+----------+--------------+ SFJ      Full                                                        +---------+---------------+---------+-----------+----------+--------------+ FV Prox  Full           Yes      Yes                                 +---------+---------------+---------+-----------+----------+--------------+ FV Mid   Full           Yes      Yes                                 +---------+---------------+---------+-----------+----------+--------------+ FV DistalFull           Yes      Yes                                 +---------+---------------+---------+-----------+----------+--------------+ PFV      Full                                                        +---------+---------------+---------+-----------+----------+--------------+ POP      Full           Yes      Yes                                 +---------+---------------+---------+-----------+----------+--------------+  PTV      Full                                                        +---------+---------------+---------+-----------+----------+--------------+ PERO     Full                                                         +---------+---------------+---------+-----------+----------+--------------+    Summary: RIGHT: - No evidence of common femoral vein obstruction.   LEFT: - There is no evidence of deep vein thrombosis in the lower extremity.  - No cystic structure found in the popliteal fossa.  *See table(s) above for measurements and observations. Electronically signed by Coral Else MD on 04/18/2023 at 6:58:38 PM.    Final         Scheduled Meds:  feeding supplement  237 mL Oral BID BM   ketorolac  15 mg Intravenous Q6H   Continuous Infusions:  azithromycin 500 mg (04/20/23 1217)   cefTRIAXone (ROCEPHIN)  IV 2 g (04/20/23 1115)   heparin 1,600 Units/hr (04/20/23 0537)     LOS: 3 days    Time spent: 45 minutes spent on chart review, discussion with nursing staff, consultants, updating family and interview/physical exam; more than 50% of that time was spent in counseling and/or coordination of care.    Joseph Art, DO [facility] Available via Epic secure chat 7am-7pm After these hours, please refer to coverage provider listed on [email or web address] 04/20/2023, 12:39 PM

## 2023-04-21 DIAGNOSIS — R918 Other nonspecific abnormal finding of lung field: Secondary | ICD-10-CM

## 2023-04-21 DIAGNOSIS — I2699 Other pulmonary embolism without acute cor pulmonale: Secondary | ICD-10-CM | POA: Diagnosis not present

## 2023-04-21 DIAGNOSIS — R053 Chronic cough: Secondary | ICD-10-CM

## 2023-04-21 LAB — CBC
HCT: 32 % — ABNORMAL LOW (ref 36.0–46.0)
Hemoglobin: 9.9 g/dL — ABNORMAL LOW (ref 12.0–15.0)
MCH: 30.3 pg (ref 26.0–34.0)
MCHC: 30.9 g/dL (ref 30.0–36.0)
MCV: 97.9 fL (ref 80.0–100.0)
Platelets: 232 10*3/uL (ref 150–400)
RBC: 3.27 MIL/uL — ABNORMAL LOW (ref 3.87–5.11)
RDW: 13.1 % (ref 11.5–15.5)
WBC: 5.6 10*3/uL (ref 4.0–10.5)
nRBC: 0 % (ref 0.0–0.2)

## 2023-04-21 LAB — HEPARIN LEVEL (UNFRACTIONATED): Heparin Unfractionated: 0.56 [IU]/mL (ref 0.30–0.70)

## 2023-04-21 MED ORDER — APIXABAN 5 MG PO TABS: 5.0000 mg | ORAL_TABLET | Freq: Two times a day (BID) | ORAL | Status: DC

## 2023-04-21 MED ORDER — APIXABAN 5 MG PO TABS
10.0000 mg | ORAL_TABLET | Freq: Two times a day (BID) | ORAL | Status: DC
  Administered 2023-04-21 – 2023-04-24 (×7): 10 mg via ORAL
  Filled 2023-04-21 (×7): qty 2

## 2023-04-21 MED ORDER — FUROSEMIDE 10 MG/ML IJ SOLN
20.0000 mg | Freq: Once | INTRAMUSCULAR | Status: AC
  Administered 2023-04-21: 20 mg via INTRAVENOUS
  Filled 2023-04-21: qty 2

## 2023-04-21 NOTE — Progress Notes (Signed)
 NAME:  Shannon Henderson, MRN:  [id number], DOB:  [birth year], LOS: 4 ADMISSION DATE:  04/17/2023, CONSULTATION DATE: 04/20/2023 REFERRING MD: Dr. 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  Echo revealed hyperdynamic EF 70-75%, G2DD, normal RV and normal PASP.  LE dopplers negative for DVT.  SARS/ flu/ RSV neg.  BNP 98, neg troponin hs.  Initial tmax 101.7 with WBC 14.8, empiric CAP coverage started.  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  Recently returned from [place] on 3/18 and remains very active, played pickleball as most recently of 3/31.  Never smoker.  No family hx of clotting disorders, no LE pain/ swelling, no dizziness/ syncope, and reports is up to date on cancer screenings, no unintentional weight loss.  Treated with heparin gtt however pt remains largely symptomatic with fatigue and exertional dyspnea requiring 2 person assist to [facility] and requiring 2L O2.  Additionally, patient has been seen outpatient recently for development of chronic cough since November.  Reports hx of snoring, no prior sleep study.  Started wearing a snore guard in October.  Has helped with her snoring but since has had ongoing mostly dry clear cough and poor sleep due to coughing spells.  Previous concerns for sinus infection with nasal congestion and sore throat over several weeks, treated with abx and steroids but no significant improvement per patient.   Denies reflux symptoms.  Given lack of improvement with treatment of PE with ongoing dry cough, fatigue and SOB, PCCM consulted for further input.   Pertinent  Medical  History  Never smoker, right BKA-from chronic pain Migraines Osteopenia  Significant Hospital Events: Including procedures, antibiotic start and stop dates in addition to other pertinent events   4/1-admitted with acute PE  Interim History / Subjective:  Was able to ambulate a little bit today  Objective   Blood pressure (!) 160/66, pulse 96, temperature 98 F (36.7 C), temperature source Oral, resp. rate (!) 22, height 6\' 1"  (1.854 m), weight 78.5 kg, SpO2 98%.       No intake or output data in the 24 hours ending 04/21/23 1235 Filed Weights   04/17/23 0640  Weight: 78.5 kg    Examination: General: Does not appear to be in distress HENT: Moist oral mucosa Lungs: Alert and oriented x 3 Cardiovascular: S1-S2 appreciated Abdomen: Soft  Resolved Hospital Problem list     Assessment & Plan:  Acute pulmonary embolism - On anticoagulation - At least 3 months of anticoagulation  Will send thrombophilia screen - Protein C, protein S and Antithrombin III are most affected by current anticoagulation - These need to be tested while off anticoagulation  -Placed order for lupus anticoagulant, anticardiolipin, beta-2 glycoprotein antibodies, factor V Leiden, prothrombin gene mutation  Chronic cough with bilateral lower lobe patchy groundglass opacity Concern for aspiration - This may be in the context of chronic aspiration with use of retainer device/snore guard - Worsening cough at night when she is using it, symptoms did improve when she stopped using it for a period  Aspiration precautions-elevating head of bed at night - Allowing stomach to empty out before bedtime  Will  benefit from esophagogram  If she continues to strengthen, should be able to discharge by Tuesday the eighth

## 2023-04-21 NOTE — Progress Notes (Signed)
 PROGRESS NOTE    Shannon Henderson  OAC:[id number] DOB: 22-Mar-1955 DOA: 04/17/2023 PCP: System, Provider Not In    Brief Narrative:  Shannon Henderson is a 68 y.o. 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  History is provided by the patient as well as her husband who is at the bedside, they state that she has had a chronic nonproductive cough for the last month or 2, without sputum production, chest pain, fevers or chills.  This morning, she had sudden onset of severe low back pain, worse with inspiration.  Workup as detailed below shows evidence of acute bilateral and subsegmental PE.  On further discussion, patient states she just returned back about a week ago from driving down to [place] and back.  She denies any lower extremity swelling or pain.    Assessment and Plan: Acute bilateral PE-without evidence of heart strain, though she does have some mild tachycardia and asymptomatic hypotension.  Initially she was hypertensive, hypotension may be related to narcotics which she received.  I suspect this is provoked by her recent long drive to [place] and back.  Patient and wife state that she has had appropriate cancer screening including mammograms and colonoscopy.  Denies any recent worrisome symptoms such as weight loss, chronic pain, nausea etc. -Initiate IV heparin drip- change to PO once able to tolerate PO -echo- done with grade 2 diastolic-- BNP elevated-- dose of IV lasix -lower extremity Dopplers negative -Pain control with p.o. and IV narcotics -treated with abx   Acute hypoxic resp failure -IS -continue to wean O2  Dry cough X several months -? GERD per pulm-- protonix added -will need to follow with Dr. Lavonia Drafts  Nausea -PRN anti-emetics  -resolved   DVT prophylaxis:  apixaban (ELIQUIS)  tablet 10 mg  apixaban (ELIQUIS) tablet 5 mg    Code Status: Full Code Family Communication: at bedside  Disposition Plan:  Level of care: Progressive Status is: Inpatient     Consultants:  pulm   Subjective: Moving better   Objective: Vitals:   04/20/23 0928 04/20/23 1232 04/20/23 1959 04/21/23 0544  BP:  (!) 121/57 (!) 147/73 (!) 160/66  Pulse:   (!) 101 96  Resp:  20 20 (!) 22  Temp:  98.6 F (37 C) 99 F (37.2 C) 98 F (36.7 C)  TempSrc:  Oral Oral Oral  SpO2: 97% 100% 97% 98%  Weight:      Height:       No intake or output data in the 24 hours ending 04/21/23 1157   Filed Weights   04/17/23 0640  Weight: 78.5 kg    Examination:   General: Appearance:    Well developed, well nourished female in no acute distress     Lungs:     respirations unlabored, on [place], diminished  Heart:    Normal heart rate.    MS:   All extremities are intact.    Neurologic:   Awake, alert       Data Reviewed: I have personally reviewed following labs and imaging studies  CBC: Recent Labs  Lab 04/17/23 0711 04/18/23 0456 04/19/23 0409 04/20/23 0359 04/21/23 0350  WBC 9.7 14.8* 11.3* 8.0 5.6  NEUTROABS 6.9  --   --   --   --   HGB 12.6 12.0 11.0* 9.9* 9.9*  HCT 40.2 37.8  35.3* 32.3* 32.0*  MCV 97.3 97.9 99.2 99.4 97.9  PLT 241 236 218 225 232   Basic Metabolic Panel: Recent Labs  Lab 04/17/23 0711 04/18/23 0456 04/19/23 0409 04/20/23 0359  NA 136 134* 132* 136  K 4.1 4.5 4.3 4.0  CL 102 100 102 105  CO2 26 22 20* 21*  GLUCOSE 110* 80 121* 94  BUN 19 17 22  24*  CREATININE 0.64 0.70 0.91 0.47  CALCIUM 8.9 9.0 8.4* 8.6*   GFR: Estimated Creatinine Clearance: 81.2 mL/min (by C-G formula based on SCr of 0.47 mg/dL). Liver Function Tests: Recent Labs  Lab 04/17/23 0711  AST 18  ALT 35  ALKPHOS 21*  BILITOT 2.0*  PROT 6.4*  ALBUMIN 3.2*   No results for input(s): "LIPASE", "AMYLASE" in the last 168 hours. No results for input(s): "AMMONIA" in  the last 168 hours. Coagulation Profile: No results for input(s): "INR", "PROTIME" in the last 168 hours. Cardiac Enzymes: No results for input(s): "CKTOTAL", "CKMB", "CKMBINDEX", "TROPONINI" in the last 168 hours. BNP (last 3 results) No results for input(s): "PROBNP" in the last 8760 hours. HbA1C: No results for input(s): "HGBA1C" in the last 72 hours. CBG: No results for input(s): "GLUCAP" in the last 168 hours. Lipid Profile: No results for input(s): "CHOL", "HDL", "LDLCALC", "TRIG", "CHOLHDL", "LDLDIRECT" in the last 72 hours. Thyroid Function Tests: No results for input(s): "TSH", "T4TOTAL", "FREET4", "T3FREE", "THYROIDAB" in the last 72 hours. Anemia Panel: No results for input(s): "VITAMINB12", "FOLATE", "FERRITIN", "TIBC", "IRON", "RETICCTPCT" in the last 72 hours. Sepsis Labs: No results for input(s): "PROCALCITON", "LATICACIDVEN" in the last 168 hours.  Recent Results (from the past 240 hours)  SARS Coronavirus 2 by RT PCR (hospital order, performed in [facility] hospital lab) *cepheid single result test* Anterior Nasal Swab     Status: None   Collection Time: 04/20/23  9:40 AM   Specimen: Anterior Nasal Swab  Result Value Ref Range Status   SARS Coronavirus 2 by RT PCR NEGATIVE NEGATIVE Final    Comment: (NOTE) SARS-CoV-2 target nucleic acids are NOT DETECTED.  The SARS-CoV-2 RNA is generally detectable in upper and lower respiratory specimens during the acute phase of infection. The lowest concentration of SARS-CoV-2 viral copies this assay can detect is 250 copies / mL. A negative result does not preclude SARS-CoV-2 infection and should not be used as the sole basis for treatment or other patient management decisions.  A negative result may occur with improper specimen collection / handling, submission of specimen other than nasopharyngeal swab, presence of viral mutation(s) within the areas targeted by this assay, and inadequate number of viral copies (<250  copies / mL). A negative result must be combined with clinical observations, patient history, and epidemiological information.  Fact Sheet for Patients:   RoadLapTop.co.za  Fact Sheet for Healthcare Providers: http://kim-miller.com/  This test is not yet approved or  cleared by the [place] FDA and has been authorized for detection and/or diagnosis of SARS-CoV-2 by FDA under an Emergency Use Authorization (EUA).  This EUA will remain in effect (meaning this test can be used) for the duration of the COVID-19 declaration under Section 564(b)(1) of the Act, 21 U.S.C. section 360bbb-3(b)(1), unless the authorization is terminated or revoked sooner.  Performed at [facility], [address] [place], [place] [address]          Radiology Studies: No results found.       Scheduled Meds:  apixaban  10 mg Oral BID  Followed by   Melene Muller ON 04/28/2023] apixaban  5 mg Oral BID   feeding supplement  237 mL Oral BID BM   ketorolac  15 mg Intravenous Q6H   pantoprazole (PROTONIX) IV  40 mg Intravenous QHS   Continuous Infusions:     LOS: 4 days    Time spent: 45 minutes spent on chart review, discussion with nursing staff, consultants, updating family and interview/physical exam; more than 50% of that time was spent in counseling and/or coordination of care.    Joseph Art, DO [facility] Available via Epic secure chat 7am-7pm After these hours, please refer to coverage provider listed on [email or web address] 04/21/2023, 11:57 AM

## 2023-04-21 NOTE — Evaluation (Signed)
 Physical Therapy Evaluation Patient Details Name: Shannon Henderson MRN: [id number] DOB: [birth year] Today's Date: 04/21/2023  History of Present Illness  68 yo female admitted with acute bil PE. Hx of R BKA, CRPS, migraines, osteopenia, rib fxs, pneumothorax  Clinical Impression  Upon my arrival to room, pt reports she had been waiting for assistance to use BSC. Noticed bed was in unlocked position-pt reports she heard "click"/sound of bed brake unlocking on its own???. Bed brake locked and remained locked for remainder of session. Pt also reported she did not think front desk could hear her when she used call button. Checked wall connection then called out to front desk to test-front desk staff was able to hear and talk to me. On eval, pt required Min A for mobility. She walked ~20 feet x 2 with a RW. Pt presents with general weakness, decreased activity tolerance, and impaired gait and balance. O2 91% on RA during session. 2/4 dyspnea + some coughing with activity, HR up to 115 bpm. Placed [place] O2 back on pt once she was back in bed.  Will plan to follow and progress activity as tolerated. Pt reports having crutches at home that she uses at night for ambulation-anticipate she should still be able to use her crutches for ambulation, as needed, upon discharge. Recommendation, at this time, is for HHPT f/u.       If plan is discharge home, recommend the following: A little help with walking and/or transfers;A little help with bathing/dressing/bathroom;Assistance with cooking/housework;Assist for transportation;Help with stairs or ramp for entrance   Can travel by private vehicle        Equipment Recommendations None recommended by PT  Recommendations for Other Services       Functional Status Assessment       Precautions / Restrictions Precautions Precautions: Fall Precaution/Restrictions Comments: monitor O2, HR Required Braces or Orthoses:  (R prosthesis-BKA) Restrictions Weight Bearing  Restrictions Per Provider Order: No      Mobility  Bed Mobility Overal bed mobility: Needs Assistance Bed Mobility: Supine to Sit, Sit to Supine     Supine to sit: Supervision, HOB elevated, Used rails Sit to supine: Supervision, HOB elevated, Used rails   General bed mobility comments: Supv for safety, lines.    Transfers Overall transfer level: Needs assistance Equipment used: Rolling walker (2 wheels), None Transfers: Sit to/from Stand, Bed to chair/wheelchair/BSC Sit to Stand: Min assist Stand pivot transfers: Min assist         General transfer comment: Min A for stand pivot, bed to recliner, without assistive device. LOB x 1 when preparing to pull up mesh panties. Cues for safety, technique, hand placement. CGA for sit to stand with use of RW. Increased time.    Ambulation/Gait Ambulation/Gait assistance: Min assist Gait Distance (Feet): 20 Feet (x2) Assistive device: Rolling walker (2 wheels) Gait Pattern/deviations: Step-to pattern       General Gait Details: Cues for safety, sequencing, proper use of RW. NO overt LOB with RW use. Intermittent light steadying assist. Took 2 laps around the room with seated rest break in between. O2 91% on RA, dyspnea 2/4, some coughing  Stairs            Wheelchair Mobility     Tilt Bed    Modified Rankin (Stroke Patients Only)       Balance Overall balance assessment: Needs assistance   Sitting balance-Leahy Scale: Good     Standing balance support: Bilateral upper extremity supported, During functional activity, Reliant on  assistive device for balance Standing balance-Leahy Scale: Fair                               Pertinent Vitals/Pain Pain Assessment Pain Assessment: Faces Faces Pain Scale: Hurts little more Pain Location: general/back Pain Descriptors / Indicators: Discomfort Pain Intervention(s): Limited activity within patient's tolerance, Monitored during session, Repositioned     Home Living Family/patient expects to be discharged to:: Private residence Living Arrangements: Spouse/significant other   Type of Home: House Home Access: Stairs to enter   [employer] of Steps: 2   Home Layout: Two level;Able to live on main level with bedroom/bathroom Home Equipment: Crutches Additional Comments: plays pickleball; former olympic equestrian    Prior Function               Mobility Comments: uses crutches at night ADLs Comments: mod independent     Extremity/Trunk Assessment   Upper Extremity Assessment Upper Extremity Assessment: Defer to OT evaluation    Lower Extremity Assessment Lower Extremity Assessment: Generalized weakness (hx R BKA)    Cervical / Trunk Assessment Cervical / Trunk Assessment: Normal  Communication   Communication Communication: No apparent difficulties    Cognition Arousal: Alert Behavior During Therapy: WFL for tasks assessed/performed   PT - Cognitive impairments: No apparent impairments                       PT - Cognition Comments: seemed a bit frustrated upon my arrival Following commands: Intact       Cueing Cueing Techniques: Verbal cues     General Comments      Exercises     Assessment/Plan    PT Assessment Patient needs continued PT services  PT Problem List Decreased strength;Decreased range of motion;Decreased activity tolerance;Decreased balance;Decreased mobility;Decreased knowledge of use of DME;Decreased safety awareness       PT Treatment Interventions DME instruction;Gait training;Stair training;Functional mobility training;Therapeutic activities;Therapeutic exercise;Patient/family education;Balance training    PT Goals (Current goals can be found in the Care Plan section)  Acute Rehab PT Goals Patient Stated Goal: regain strength. PT Goal Formulation: With patient Time For Goal Achievement: 05/05/23 Potential to Achieve Goals: Good    Frequency Min  2X/week     Co-evaluation               AM-PAC PT "6 Clicks" Mobility  Outcome Measure Help needed turning from your back to your side while in a flat bed without using bedrails?: A Little Help needed moving from lying on your back to sitting on the side of a flat bed without using bedrails?: A Little Help needed moving to and from a bed to a chair (including a wheelchair)?: A Little Help needed standing up from a chair using your arms (e.g., wheelchair or bedside chair)?: A Little Help needed to walk in hospital room?: A Little Help needed climbing 3-5 steps with a railing? : A Lot 6 Click Score: 17    End of Session Equipment Utilized During Treatment: Gait belt Activity Tolerance: Patient tolerated treatment well Patient left: in bed;with call bell/phone within reach;with bed alarm set   PT Visit Diagnosis: Muscle weakness (generalized) (M62.81);Difficulty in walking, not elsewhere classified (R26.2)    Time: 1610-9604 PT Time Calculation (min) (ACUTE ONLY): 24 min   Charges:   PT Evaluation $PT Eval Low Complexity: 1 Low PT Treatments $Gait Training: 8-22 mins PT General Charges $$ ACUTE PT  VISIT: 1 Visit           Faye Ramsay, PT Acute Rehabilitation  Office: [phone]

## 2023-04-21 NOTE — Progress Notes (Signed)
 PHARMACY - ANTICOAGULATION CONSULT NOTE  Pharmacy Consult for heparin Indication: pulmonary embolus  No Known Allergies  Patient Measurements: Height: 6\' 1"  (185.4 cm) Weight: 78.5 kg (173 lb) IBW/kg (Calculated) : 75.4 HEPARIN DW (KG): 78.5  Vital Signs: Temp: 99 F (37.2 C) (04/04 1959) Temp Source: Oral (04/04 1959) BP: 147/73 (04/04 1959) Pulse Rate: 101 (04/04 1959)  Labs: Recent Labs    04/18/23 0456 04/19/23 0409 04/19/23 1350 04/20/23 0359 04/20/23 1144 04/21/23 0350  HGB 12.0 11.0*  --  9.9*  --  9.9*  HCT 37.8 35.3*  --  32.3*  --  32.0*  PLT 236 218  --  225  --  232  HEPARINUNFRC 0.65 0.34   < > 0.27* 0.42 0.56  CREATININE 0.70 0.91  --  0.47  --   --    < > = values in this interval not displayed.    Estimated Creatinine Clearance: 81.2 mL/min (by C-G formula based on SCr of 0.47 mg/dL).   Medical History: Past Medical History:  Diagnosis Date   Amputee, below knee, right (HCC)    Fracture of multiple ribs 04/19/2017   L ribs 5-10 closed fractures   Pneumothorax on left 04/19/2017   s/p fall from horse    Medications: No anticoagulants PTA  Assessment: 37 yoF presenting with cough, chest pain. CTA revealed bilateral PE, no evidence of RHS. Pharmacy consulted to dose heparin.   Today, 04/21/23 Heparin level = 0.56 therapeutic on 1600 units/hr CBC: Hgb 9.9, plts 232 No bleeding reported  Goal of Therapy:  Heparin level 0.3-0.7 units/ml Monitor platelets by anticoagulation protocol: Yes   Plan:  continue heparin drip at 1600 units/hr Daily heparin level and CBC  Arley Phenix RPh 04/21/2023, 4:40 AM

## 2023-04-21 NOTE — Evaluation (Signed)
 Clinical/Bedside Swallow Evaluation Patient Details  Name: Vani Gunner MRN: [id number] Date of Birth: 1956/01/15  Today's Date: 04/21/2023 Time: SLP Start Time (ACUTE ONLY): 1015 SLP Stop Time (ACUTE ONLY): 1025 SLP Time Calculation (min) (ACUTE ONLY): 10 min  Past Medical History:  Past Medical History:  Diagnosis Date   Amputee, below knee, right (HCC)    Fracture of multiple ribs 04/19/2017   L ribs 5-10 closed fractures   Pneumothorax on left 04/19/2017   s/p fall from horse   Past Surgical History: History reviewed. No pertinent surgical history. HPI:  Patient is a 68 y.o. female with PMH: right BKA due to chronic pain syndrome who was admitted to the hospital on 04/17/23 with symptomatic acute bilateral PE. Patient reported a nonproductive cough for the past month or two. CT angio/chest/PE showed small to moderate hiatal hernia and patulous esophagus, bilateral LL segmental and subsegmental PE. SLP swallow evaluation ordered secondary to concern for potential silent aspiration.    Assessment / Plan / Recommendation  Clinical Impression  Patient is not currently presenting with clinical s/s of oropharyngeal dysphagia as per this bedside swallow evaluation. She denies any h/o reflux/GERD and denies ever having to take GERD medications. Swallow initiation was timely and no overt s/s aspiration observed. As per CT angio/chest/PE, patient with small to moderate hiatal hernia and patulous esophagus. SLP secure messaged attending MD who included pulmonary MD with recommendation for barium esophagram. SLP will s/o at this time but please reorder if indicated after barium esophagram. SLP Visit Diagnosis: Dysphagia, unspecified (R13.10)    Aspiration Risk  Mild aspiration risk    Diet Recommendation Regular;Thin liquid    Liquid Administration via: Cup;Straw Medication Administration: Whole meds with liquid Supervision: Patient able to self feed Postural Changes: Seated upright at 90  degrees;Remain upright for at least 30 minutes after po intake    Other  Recommendations Oral Care Recommendations: Oral care BID;Patient independent with oral care    Recommendations for follow up therapy are one component of a multi-disciplinary discharge planning process, led by the attending physician.  Recommendations may be updated based on patient status, additional functional criteria and insurance authorization.  Follow up Recommendations No SLP follow up      Assistance Recommended at Discharge    Functional Status Assessment Patient has not had a recent decline in their functional status  Frequency and Duration   N/A         Prognosis   N/A     Swallow Study   General Date of Onset: 04/20/23 HPI: Patient is a 68 y.o. female with PMH: right BKA due to chronic pain syndrome who was admitted to the hospital on 04/17/23 with symptomatic acute bilateral PE. Patient reported a nonproductive cough for the past month or two. CT angio/chest/PE showed small to moderate hiatal hernia and patulous esophagus, bilateral LL segmental and subsegmental PE. SLP swallow evaluation ordered secondary to concern for potential silent aspiration. Type of Study: Bedside Swallow Evaluation Previous Swallow Assessment: none found Diet Prior to this Study: Regular;Thin liquids (Level 0) Temperature Spikes Noted: No Respiratory Status: Room air History of Recent Intubation: No Behavior/Cognition: Alert;Cooperative;Pleasant mood Oral Cavity Assessment: Within Functional Limits Oral Care Completed by SLP: No Oral Cavity - Dentition: Adequate natural dentition Vision: Functional for self-feeding Self-Feeding Abilities: Able to feed self Patient Positioning: Upright in bed Baseline Vocal Quality: Normal Volitional Cough: Strong Volitional Swallow: Able to elicit    Oral/Motor/Sensory Function Overall Oral Motor/Sensory Function: Within functional limits  Ice Chips     Thin Liquid Thin Liquid:  Within functional limits Presentation: Straw;Self Fed    Nectar Thick     Honey Thick     Puree Puree: Not tested   Solid     Solid: Not tested     Angela Nevin, MA, CCC-SLP Speech Therapy

## 2023-04-21 NOTE — Progress Notes (Signed)
 PHARMACY - ANTICOAGULATION CONSULT NOTE  Pharmacy Consult for apixaban  Indication: pulmonary embolus  No Known Allergies  Patient Measurements: Height: 6\' 1"  (185.4 cm) Weight: 78.5 kg (173 lb) IBW/kg (Calculated) : 75.4 HEPARIN DW (KG): 78.5  Vital Signs: Temp: 98 F (36.7 C) (04/05 0544) Temp Source: Oral (04/05 0544) BP: 160/66 (04/05 0544) Pulse Rate: 96 (04/05 0544)  Labs: Recent Labs    04/19/23 0409 04/19/23 1350 04/20/23 0359 04/20/23 1144 04/21/23 0350  HGB 11.0*  --  9.9*  --  9.9*  HCT 35.3*  --  32.3*  --  32.0*  PLT 218  --  225  --  232  HEPARINUNFRC 0.34   < > 0.27* 0.42 0.56  CREATININE 0.91  --  0.47  --   --    < > = values in this interval not displayed.    Estimated Creatinine Clearance: 81.2 mL/min (by C-G formula based on SCr of 0.47 mg/dL).   Medical History: Past Medical History:  Diagnosis Date   Amputee, below knee, right (HCC)    Fracture of multiple ribs 04/19/2017   L ribs 5-10 closed fractures   Pneumothorax on left 04/19/2017   s/p fall from horse    Medications: No anticoagulants PTA  Assessment: 7 yoF presenting with cough, chest pain. CTA revealed bilateral PE, no evidence of RHS. Pharmacy consulted to dose heparin.   Today, 04/21/23 Heparin level = 0.56 therapeutic on 1600 units/hr CBC: Hgb 9.9, plts 232 No bleeding reported This AM, plan is to transition to apixaban   Goal of Therapy:  Heparin level 0.3-0.7 units/ml Monitor platelets by anticoagulation protocol: Yes   Plan:  Stop heparin drip and start apixaban 10 mg BID x 7 days then 5 mg PO BID  Monitor CBC, Scr  Monitor for signs and symptoms of bleeding   Adalberto Cole, PharmD, BCPS 04/21/2023 9:56 AM

## 2023-04-22 DIAGNOSIS — R053 Chronic cough: Secondary | ICD-10-CM | POA: Diagnosis not present

## 2023-04-22 DIAGNOSIS — I2699 Other pulmonary embolism without acute cor pulmonale: Secondary | ICD-10-CM | POA: Diagnosis not present

## 2023-04-22 DIAGNOSIS — R918 Other nonspecific abnormal finding of lung field: Secondary | ICD-10-CM | POA: Diagnosis not present

## 2023-04-22 LAB — BASIC METABOLIC PANEL WITH GFR
Anion gap: 8 (ref 5–15)
BUN: 19 mg/dL (ref 8–23)
CO2: 24 mmol/L (ref 22–32)
Calcium: 9 mg/dL (ref 8.9–10.3)
Chloride: 106 mmol/L (ref 98–111)
Creatinine, Ser: 0.6 mg/dL (ref 0.44–1.00)
GFR, Estimated: >60 mL/min (ref >60)
Glucose, Bld: 101 mg/dL — ABNORMAL HIGH (ref 70–99)
Potassium: 3.9 mmol/L (ref 3.5–5.1)
Sodium: 138 mmol/L (ref 135–145)

## 2023-04-22 LAB — CBC
HCT: 32.2 % — ABNORMAL LOW (ref 36.0–46.0)
Hemoglobin: 10.3 g/dL — ABNORMAL LOW (ref 12.0–15.0)
MCH: 30 pg (ref 26.0–34.0)
MCHC: 32 g/dL (ref 30.0–36.0)
MCV: 93.9 fL (ref 80.0–100.0)
Platelets: 252 10*3/uL (ref 150–400)
RBC: 3.43 MIL/uL — ABNORMAL LOW (ref 3.87–5.11)
RDW: 12.9 % (ref 11.5–15.5)
WBC: 6 10*3/uL (ref 4.0–10.5)
nRBC: 0 % (ref 0.0–0.2)

## 2023-04-22 MED ORDER — OXYCODONE HCL 5 MG PO TABS: 10.0000 mg | ORAL_TABLET | ORAL | Status: DC | PRN

## 2023-04-22 MED ORDER — TRAMADOL HCL 50 MG PO TABS: 50.0000 mg | ORAL_TABLET | Freq: Two times a day (BID) | ORAL | Status: DC | PRN

## 2023-04-22 MED ORDER — MIRTAZAPINE 15 MG PO TABS
15.0000 mg | ORAL_TABLET | Freq: Every day | ORAL | Status: DC
  Administered 2023-04-22 – 2023-04-23 (×2): 15 mg via ORAL
  Filled 2023-04-22 (×2): qty 1

## 2023-04-22 MED ORDER — FLUTICASONE PROPIONATE 50 MCG/ACT NA SUSP: 1.0000 | Freq: Every day | NASAL | Status: DC | PRN

## 2023-04-22 MED ORDER — HYDROCODONE BIT-HOMATROP MBR 5-1.5 MG/5ML PO SOLN
5.0000 mL | Freq: Four times a day (QID) | ORAL | Status: DC | PRN
  Administered 2023-04-22 – 2023-04-24 (×4): 5 mL via ORAL
  Filled 2023-04-22 (×4): qty 5

## 2023-04-22 NOTE — Plan of Care (Signed)
  Problem: Clinical Measurements: Goal: Respiratory complications will improve Outcome: Progressing   Problem: Activity: Goal: Risk for activity intolerance will decrease Outcome: Progressing   Problem: Nutrition: Goal: Adequate nutrition will be maintained Outcome: Progressing   Problem: Pain Managment: Goal: General experience of comfort will improve and/or be controlled Outcome: Progressing   Problem: Activity: Goal: Risk for activity intolerance will decrease Outcome: Progressing   Problem: Safety: Goal: Ability to remain free from injury will improve Outcome: Progressing

## 2023-04-22 NOTE — Progress Notes (Addendum)
 NAME:  Shannon Henderson, MRN:  [id number], DOB:  [birth year], LOS: 5 ADMISSION DATE:  04/17/2023, CONSULTATION DATE: 04/20/2023 REFERRING MD: Dr. 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  Echo revealed hyperdynamic EF 70-75%, G2DD, normal RV and normal PASP.  LE dopplers negative for DVT.  SARS/ flu/ RSV neg.  BNP 98, neg troponin hs.  Initial tmax 101.7 with WBC 14.8, empiric CAP coverage started.  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  Recently returned from [place] on 3/18 and remains very active, played pickleball as most recently of 3/31.  Never smoker.  No family hx of clotting disorders, no LE pain/ swelling, no dizziness/ syncope, and reports is up to date on cancer screenings, no unintentional weight loss.  Treated with heparin gtt however pt remains largely symptomatic with fatigue and exertional dyspnea requiring 2 person assist to [facility] and requiring 2L O2.  Additionally, patient has been seen outpatient recently for development of chronic cough since November.  Reports hx of snoring, no prior sleep study.  Started wearing a snore guard in October.  Has helped with her snoring but since has had ongoing mostly dry clear cough and poor sleep due to coughing spells.  Previous concerns for sinus infection with nasal congestion and sore throat over several weeks, treated with abx and steroids but no significant improvement per patient.   Denies reflux symptoms.  Given lack of improvement with treatment of PE with ongoing dry cough, fatigue and SOB, PCCM consulted for further input.   Pertinent  Medical  History  Never smoker, right BKA-from chronic pain Migraines Osteopenia  Significant Hospital Events: Including procedures, antibiotic start and stop dates in addition to other pertinent events   4/1-admitted with acute PE  Interim History / Subjective:  Tolerated more ambulation Still gets exhausted pretty quickly Cough  Objective   Blood pressure (!) 168/74, pulse 88, temperature 98.7 F (37.1 C), temperature source Oral, resp. rate (!) 22, height 6\' 1"  (1.854 m), weight 78.5 kg, SpO2 99%.        Intake/Output Summary (Last 24 hours) at 04/22/2023 1032 Last data filed at 04/22/2023 0900 Gross per 24 hour  Intake 120 ml  Output 800 ml  Net -680 ml   Filed Weights   04/17/23 0640  Weight: 78.5 kg    Examination: General: Not in distress HENT: Moist oral mucosa Lungs: Clear breath sounds Cardiovascular: S1-S2 appreciated Abdomen: Soft  Resolved Hospital Problem list     Assessment & Plan:   Acute pulmonary embolism - On anticoagulation - At least 3 months of anticoagulation recommended  Thrombophilia screening sent - Protein C, protein S and Antithrombin III are most affected by current anticoagulation - These need to be tested while off anticoagulation  -Placed order for lupus anticoagulant, anticardiolipin, beta-2 glycoprotein antibodies, factor V Leiden, prothrombin gene mutation  Chronic cough, groundglass opacification at bases, worse on the left - May be related to chronic aspiration - Worsening cough at night when she is using snore guard, symptoms did improve when she stopped using the snore guard for some period  Aspiration precautions-elevating head of bed at night -  Allowing stomach to empty before bedtime  Will benefit from esophagogram - Patient recollects over 30 years ago having some barium study done for dysphagia  Encouraged to stay active

## 2023-04-22 NOTE — Evaluation (Signed)
 Occupational Therapy Evaluation Patient Details Name: Shannon Henderson MRN: [id number] DOB: 01/07/1956 Today's Date: 04/22/2023   History of Present Illness   68 yo female admitted with acute bil PE. Hx of R BKA, CRPS, migraines, osteopenia, rib fxs, pneumothorax     Clinical Impressions Pt is typically active and independent. She only walks with loftstrand crutches at night. Pt presents with weakness. She requires increased time and effort for sit to stand. She demonstrated ability to ambulate entire unit with her crutches with CGA. HR 114, SpO2 91% on RA with ambulation. Pt needs set up to min assist for ADLs. Pt likely to progress well towards ADL independence and not require post acute OT. Will follow acutely.      If plan is discharge home, recommend the following:   A little help with walking and/or transfers;A little help with bathing/dressing/bathroom;Help with stairs or ramp for entrance     Functional Status Assessment   Patient has had a recent decline in their functional status and demonstrates the ability to make significant improvements in function in a reasonable and predictable amount of time.     Equipment Recommendations   None recommended by OT     Recommendations for Other Services         Precautions/Restrictions   Precautions Precautions: Fall Precaution/Restrictions Comments: monitor O2, HR Restrictions Weight Bearing Restrictions Per Provider Order: No     Mobility Bed Mobility Overal bed mobility: Modified Independent                  Transfers Overall transfer level: Needs assistance Equipment used: Rolling walker (2 wheels), Crutches Transfers: Sit to/from Stand Sit to Stand: Contact guard assist           General transfer comment: CGA from bed and low toilet, increased effort      Balance Overall balance assessment: Needs assistance   Sitting balance-Leahy Scale: Good     Standing balance support: No upper  extremity supported, During functional activity Standing balance-Leahy Scale: Fair Standing balance comment: able to stand without UE support to manage panties                           ADL either performed or assessed with clinical judgement   ADL Overall ADL's : Needs assistance/impaired Eating/Feeding: Independent;Sitting   Grooming: Set up;Sitting   Upper Body Bathing: Set up;Sitting   Lower Body Bathing: Contact guard assist;Sit to/from stand   Upper Body Dressing : Set up;Sitting   Lower Body Dressing: Minimal assistance;Sit to/from stand Lower Body Dressing Details (indicate cue type and reason): assist to start panties over feet Toilet Transfer: Contact guard assist;Ambulation;Regular Toilet;Grab bars   Toileting- Clothing Manipulation and Hygiene: Contact guard assist;Sit to/from stand Toileting - Clothing Manipulation Details (indicate cue type and reason): managed pericare without assist, pulled up panties with CGA     Functional mobility during ADLs: Contact guard assist (loftstrand crutches)       Vision Ability to See in Adequate Light: 0 Adequate Patient Visual Report: No change from baseline       Perception         Praxis         Pertinent Vitals/Pain Pain Assessment Pain Assessment: No/denies pain     Extremity/Trunk Assessment     Lower Extremity Assessment Lower Extremity Assessment: Overall WFL for tasks assessed   Cervical / Trunk Assessment Cervical / Trunk Assessment: Normal   Communication Communication Communication: No apparent  difficulties   Cognition Arousal: Alert Behavior During Therapy: WFL for tasks assessed/performed Cognition: No apparent impairments                               Following commands: Intact       Cueing  General Comments   Cueing Techniques: Verbal cues      Exercises     Shoulder Instructions      Home Living Family/patient expects to be discharged to:: Private  residence Living Arrangements: Spouse/significant other Available Help at Discharge: Family;Available PRN/intermittently Type of Home: House Home Access: Stairs to enter Entrance Stairs-Number of Steps: 2   Home Layout: Two level;Able to live on main level with bedroom/bathroom     Bathroom Shower/Tub: Walk-in shower         Home Equipment: Crutches   Additional Comments: plays pickleball; former olympic equestrian      Prior Functioning/Environment Prior Level of Function : Independent/Modified Independent;Driving             Mobility Comments: uses crutches at night ADLs Comments: mod independent    OT Problem List: Decreased activity tolerance;Impaired balance (sitting and/or standing)   OT Treatment/Interventions: Self-care/ADL training;Therapeutic activities;Patient/family education;Balance training      OT Goals(Current goals can be found in the care plan section)   Acute Rehab OT Goals OT Goal Formulation: With patient Time For Goal Achievement: 05/06/23 Potential to Achieve Goals: Good ADL Goals Pt Will Perform Grooming: with modified independence;standing Pt Will Perform Lower Body Bathing: with modified independence;sit to/from stand Pt Will Perform Lower Body Dressing: with modified independence;sit to/from stand Pt Will Transfer to Toilet: with modified independence;ambulating Pt Will Perform Toileting - Clothing Manipulation and hygiene: with modified independence;sit to/from stand Pt Will Perform Tub/Shower Transfer: Shower transfer;with modified independence;ambulating   OT Frequency:  Min 2X/week    Co-evaluation              AM-PAC OT "6 Clicks" Daily Activity     Outcome Measure Help from another person eating meals?: None Help from another person taking care of personal grooming?: A Little Help from another person toileting, which includes using toliet, bedpan, or urinal?: A Little Help from another person bathing (including  washing, rinsing, drying)?: A Little Help from another person to put on and taking off regular upper body clothing?: A Little Help from another person to put on and taking off regular lower body clothing?: A Little 6 Click Score: 19   End of Session Equipment Utilized During Treatment: Gait belt;Other (comment) (crutches) Nurse Communication: Mobility status;Other (comment) (needs to walk to bathroom)  Activity Tolerance: Patient tolerated treatment well Patient left: in chair;with call bell/phone within reach  OT Visit Diagnosis: Unsteadiness on feet (R26.81);Other abnormalities of gait and mobility (R26.89);Muscle weakness (generalized) (M62.81)                Time: 4098-1191 OT Time Calculation (min): 35 min Charges:  OT General Charges $OT Visit: 1 Visit OT Evaluation $OT Eval Low Complexity: 1 Low OT Treatments $Self Care/Home Management : 8-22 mins  Berna Spare, OTR/L Acute Rehabilitation Services Office: [phone]   Evern Bio 04/22/2023, 2:12 PM

## 2023-04-22 NOTE — Progress Notes (Addendum)
 PROGRESS NOTE    Shannon Henderson  WUJ:[id number] DOB: 11-05-1955 DOA: 04/17/2023 PCP: System, Provider Not In    Brief Narrative:  Shannon Henderson is a 68 y.o. 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  History is provided by the patient as well as her husband who is at the bedside, they state that she has had a chronic nonproductive cough for the last month or 2, without sputum production, chest pain, fevers or chills.  This morning, she had sudden onset of severe low back pain, worse with inspiration.  Workup as detailed below shows evidence of acute bilateral and subsegmental PE.  On further discussion, patient states she just returned back about a week ago from driving down to [place] and back. Slow to recover   Assessment and Plan: Acute bilateral PE-without evidence of heart strain, though she does have some mild tachycardia and asymptomatic hypotension.  Initially she was hypertensive, hypotension may be related to narcotics which she received.  I suspect this is provoked by her recent long drive to [place] and back.  Patient and wife state that she has had appropriate cancer screening including mammograms and colonoscopy.  Denies any recent worrisome symptoms such as weight loss, chronic pain, nausea etc. -Initiate IV heparin drip- change to PO eliquis -echo- done with grade 2 diastolic-- BNP elevated-- dose of IV lasix with good results -lower extremity Dopplers negative -Pain control with p.o. and IV narcotics -treated with abx   Acute hypoxic resp failure -IS -continue to wean O2  Dry cough X several months -? GERD per pulm-- protonix added -will need to follow with Dr. Lavonia Drafts -esophagram ordered for the AM  Nausea -PRN anti-emetics  -resolved   DVT prophylaxis:  apixaban (ELIQUIS) tablet  10 mg  apixaban (ELIQUIS) tablet 5 mg    Code Status: Full Code Family Communication: at bedside  Disposition Plan:  Level of care: Progressive Status is: Inpatient     Consultants:  pulm   Subjective: Moving better, feels less swollen   Objective: Vitals:   04/20/23 1959 04/21/23 0544 04/21/23 2014 04/22/23 0537  BP: (!) 147/73 (!) 160/66 (!) 160/79 (!) 168/74  Pulse: (!) 101 96 94 88  Resp: 20 (!) 22 20 (!) 22  Temp: 99 F (37.2 C) 98 F (36.7 C) 99.5 F (37.5 C) 98.7 F (37.1 C)  TempSrc: Oral Oral Oral Oral  SpO2: 97% 98% 97% 99%  Weight:      Height:        Intake/Output Summary (Last 24 hours) at 04/22/2023 1210 Last data filed at 04/22/2023 0900 Gross per 24 hour  Intake 120 ml  Output 800 ml  Net -680 ml     Filed Weights   04/17/23 0640  Weight: 78.5 kg    Examination:   General: Appearance:    Well developed, well nourished female in no acute distress     Lungs:     respirations unlabored, on [place], diminished  Heart:    Normal heart rate.    MS:  S/p RLE amputations   Neurologic:   Awake, alert       Data Reviewed: I have personally reviewed following labs and imaging studies  CBC: Recent Labs  Lab 04/17/23 0711 04/18/23 0456 04/19/23 0409 04/20/23 0359 04/21/23 0350 04/22/23 0338  WBC 9.7 14.8* 11.3* 8.0 5.6 6.0  NEUTROABS 6.9  --   --   --   --   --   HGB 12.6 12.0 11.0* 9.9* 9.9* 10.3*  HCT 40.2 37.8 35.3* 32.3* 32.0* 32.2*  MCV 97.3 97.9 99.2 99.4 97.9 93.9  PLT 241 236 218 225 232 252   Basic Metabolic Panel: Recent Labs  Lab 04/17/23 0711 04/18/23 0456 04/19/23 0409 04/20/23 0359 04/22/23 0338  NA 136 134* 132* 136 138  K 4.1 4.5 4.3 4.0 3.9  CL 102 100 102 105 106  CO2 26 22 20* 21* 24  GLUCOSE 110* 80 121* 94 101*  BUN 19 17 22  24* 19  CREATININE 0.64 0.70 0.91 0.47 0.60  CALCIUM 8.9 9.0 8.4* 8.6* 9.0   GFR: Estimated Creatinine Clearance: 81.2 mL/min (by C-G formula based on SCr of 0.6 mg/dL). Liver  Function Tests: Recent Labs  Lab 04/17/23 0711  AST 18  ALT 35  ALKPHOS 21*  BILITOT 2.0*  PROT 6.4*  ALBUMIN 3.2*   No results for input(s): "LIPASE", "AMYLASE" in the last 168 hours. No results for input(s): "AMMONIA" in the last 168 hours. Coagulation Profile: No results for input(s): "INR", "PROTIME" in the last 168 hours. Cardiac Enzymes: No results for input(s): "CKTOTAL", "CKMB", "CKMBINDEX", "TROPONINI" in the last 168 hours. BNP (last 3 results) No results for input(s): "PROBNP" in the last 8760 hours. HbA1C: No results for input(s): "HGBA1C" in the last 72 hours. CBG: No results for input(s): "GLUCAP" in the last 168 hours. Lipid Profile: No results for input(s): "CHOL", "HDL", "LDLCALC", "TRIG", "CHOLHDL", "LDLDIRECT" in the last 72 hours. Thyroid Function Tests: No results for input(s): "TSH", "T4TOTAL", "FREET4", "T3FREE", "THYROIDAB" in the last 72 hours. Anemia Panel: No results for input(s): "VITAMINB12", "FOLATE", "FERRITIN", "TIBC", "IRON", "RETICCTPCT" in the last 72 hours. Sepsis Labs: No results for input(s): "PROCALCITON", "LATICACIDVEN" in the last 168 hours.  Recent Results (from the past 240 hours)  SARS Coronavirus 2 by RT PCR (hospital order, performed in [facility] hospital lab) *cepheid single result test* Anterior Nasal Swab     Status: None   Collection Time: 04/20/23  9:40 AM   Specimen: Anterior Nasal Swab  Result Value Ref Range Status   SARS Coronavirus 2 by RT PCR NEGATIVE NEGATIVE Final    Comment: (NOTE) SARS-CoV-2 target nucleic acids are NOT DETECTED.  The SARS-CoV-2 RNA is generally detectable in upper and lower respiratory specimens during the acute phase of infection. The lowest concentration of SARS-CoV-2 viral copies this assay can detect is 250 copies / mL. A negative result does not preclude SARS-CoV-2 infection and should not be used as the sole basis for treatment or other patient management decisions.  A negative result  may occur with improper specimen collection / handling, submission of specimen other than nasopharyngeal swab, presence of viral mutation(s) within the areas targeted by this assay, and inadequate number of viral copies (<250 copies / mL). A negative result must be combined with clinical observations, patient history, and epidemiological information.  Fact Sheet for Patients:   RoadLapTop.co.za  Fact Sheet for Healthcare Providers: http://kim-miller.com/  This test is not yet approved or  cleared by the [place] FDA and has been authorized for detection and/or diagnosis of SARS-CoV-2 by FDA under an Emergency Use Authorization (EUA).  This EUA will remain in effect (meaning this test can be used) for the duration of the COVID-19 declaration under Section 564(b)(1) of the Act, 21 U.S.C. section 360bbb-3(b)(1), unless the authorization is terminated or revoked sooner.  Performed at [facility], [address], [place], [place] [address]          Radiology Studies: No results found.       Scheduled Meds:  apixaban  10 mg Oral BID   Followed by   Melene Muller ON 04/28/2023] apixaban  5 mg Oral BID   feeding supplement  237 mL Oral BID BM   pantoprazole (PROTONIX) IV  40 mg Intravenous QHS   Continuous Infusions:     LOS: 5 days    Time spent: 45 minutes spent on chart review, discussion with nursing staff, consultants, updating family and interview/physical exam; more than 50% of that time was spent in counseling and/or coordination of care.    Joseph Art, DO [facility] Available via Epic secure chat 7am-7pm After these hours, please refer to coverage provider listed on [email or web address] 04/22/2023, 12:10 PM

## 2023-04-22 NOTE — Plan of Care (Signed)
°  Problem: Education: Goal: Knowledge of General Education information will improve Description: Including pain rating scale, medication(s)/side effects and non-pharmacologic comfort measures Outcome: Progressing   Problem: Clinical Measurements: Goal: Will remain free from infection Outcome: Progressing Goal: Diagnostic test results will improve Outcome: Progressing Goal: Respiratory complications will improve Outcome: Progressing Goal: Cardiovascular complication will be avoided Outcome: Progressing   Problem: Nutrition: Goal: Adequate nutrition will be maintained Outcome: Progressing   

## 2023-04-23 ENCOUNTER — Inpatient Hospital Stay (HOSPITAL_COMMUNITY)

## 2023-04-23 ENCOUNTER — Telehealth: Payer: Self-pay | Admitting: Critical Care Medicine

## 2023-04-23 DIAGNOSIS — G4733 Obstructive sleep apnea (adult) (pediatric): Secondary | ICD-10-CM | POA: Diagnosis not present

## 2023-04-23 DIAGNOSIS — I2699 Other pulmonary embolism without acute cor pulmonale: Secondary | ICD-10-CM | POA: Diagnosis not present

## 2023-04-23 DIAGNOSIS — K219 Gastro-esophageal reflux disease without esophagitis: Secondary | ICD-10-CM

## 2023-04-23 LAB — CARDIOLIPIN ANTIBODIES, IGG, IGM, IGA
Anticardiolipin IgA: <9 U/mL (ref 0–11)
Anticardiolipin IgG: <9 GPL U/mL (ref 0–14)
Anticardiolipin IgM: <9 [MPL'U]/mL (ref 0–12)

## 2023-04-23 LAB — BETA-2-GLYCOPROTEIN I ABS, IGG/M/A
Beta-2 Glyco I IgG: <9 GPI IgG units (ref 0–20)
Beta-2-Glycoprotein I IgA: <9 GPI IgA units (ref 0–25)
Beta-2-Glycoprotein I IgM: <9 GPI IgM units (ref 0–32)

## 2023-04-23 LAB — PTT-LA MIX: PTT-LA Mix: 45.6 s — ABNORMAL HIGH (ref 0.0–40.5)

## 2023-04-23 LAB — LUPUS ANTICOAGULANT PANEL
DRVVT: 126.5 s — ABNORMAL HIGH (ref 0.0–47.0)
PTT Lupus Anticoagulant: 53.2 s — ABNORMAL HIGH (ref 0.0–43.5)

## 2023-04-23 LAB — DRVVT CONFIRM: dRVVT Confirm: 1.4 ratio — ABNORMAL HIGH (ref 0.8–1.2)

## 2023-04-23 LAB — HEXAGONAL PHASE PHOSPHOLIPID: Hexagonal Phase Phospholipid: 8 s (ref 0–11)

## 2023-04-23 LAB — DRVVT MIX: dRVVT Mix: 59.8 s — ABNORMAL HIGH (ref 0.0–40.4)

## 2023-04-23 MED ORDER — LORATADINE 10 MG PO TABS
10.0000 mg | ORAL_TABLET | Freq: Every day | ORAL | Status: DC
  Administered 2023-04-23 – 2023-04-24 (×2): 10 mg via ORAL
  Filled 2023-04-23 (×2): qty 1

## 2023-04-23 MED ORDER — FLUTICASONE PROPIONATE 50 MCG/ACT NA SUSP
1.0000 | Freq: Every day | NASAL | Status: DC
  Administered 2023-04-23 – 2023-04-24 (×2): 1 via NASAL
  Filled 2023-04-23: qty 16

## 2023-04-23 MED ORDER — LORATADINE 10 MG PO TABS
10.0000 mg | ORAL_TABLET | Freq: Every day | ORAL | 1 refills | Status: DC
Start: 2023-04-23 — End: 2023-09-06

## 2023-04-23 MED ORDER — FLUTICASONE PROPIONATE 50 MCG/ACT NA SUSP: 1.0000 | Freq: Every day | NASAL | 1 refills | Status: DC

## 2023-04-23 NOTE — Progress Notes (Signed)
 PROGRESS NOTE    Shannon Henderson  ZOX:[id number] DOB: 04/29/1955 DOA: 04/17/2023 PCP: System, Provider Not In    Brief Narrative:  Shannon Henderson is a 68 y.o. 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  History is provided by the patient as well as her husband who is at the bedside, they state that she has had a chronic nonproductive cough for the last month or 2, without sputum production, chest pain, fevers or chills.  This morning, she had sudden onset of severe low back pain, worse with inspiration.  Workup as detailed below shows evidence of acute bilateral and subsegmental PE.  On further discussion, patient states she just returned back about a week ago from driving down to [place] and back. Slow to recover and hospital stay complicated by issues with GERD and continued cough along with weakness.   Assessment and Plan: Acute bilateral PE-without evidence of heart strain, though she does have some mild tachycardia and asymptomatic hypotension.  Initially she was hypertensive, hypotension may be related to narcotics which she received.  I suspect this is provoked by her recent long drive to [place] and back.  Patient and wife state that she has had appropriate cancer screening including mammograms and colonoscopy.  Denies any recent worrisome symptoms such as weight loss, chronic pain, nausea etc. - IV heparin drip- changed to PO eliquis -echo- done with grade 2 diastolic-- BNP elevated-- dose of IV lasix with good results -lower extremity Dopplers negative -Pain control with p.o. and IV narcotics -treated with abx   Acute hypoxic resp failure -IS -off O2 today  Dry cough X several months -? GERD per pulm-- protonix added -will need to follow with Dr. Lavonia Drafts -esophagram ordered and done -allergy  medication added 4/7  Nausea -PRN anti-emetics  -resolved   DVT prophylaxis:  apixaban (ELIQUIS) tablet 10 mg  apixaban (ELIQUIS) tablet 5 mg    Code Status: Full Code Family Communication: at bedside 4/6  Disposition Plan:  Level of care: Progressive Status is: Inpatient     Consultants:  pulm   Subjective: Feels like strength is better   Objective: Vitals:   04/22/23 1500 04/22/23 2000 04/23/23 0329 04/23/23 1050  BP: 134/64 128/62 (!) 154/68 (!) 129/54  Pulse: 96 84 83 98  Resp:  20 20 18   Temp: 98.2 F (36.8 C) 98.7 F (37.1 C) 99.3 F (37.4 C) 98 F (36.7 C)  TempSrc: Oral Oral Oral Oral  SpO2: 100% 93% 91% 98%  Weight:      Height:        Intake/Output Summary (Last 24 hours) at 04/23/2023 1241 Last data filed at 04/23/2023 1200 Gross per 24 hour  Intake 480 ml  Output --  Net 480 ml     Filed Weights   04/17/23 0640  Weight: 78.5 kg    Examination:    General: Appearance:    Well developed, well nourished female in no acute distress     Lungs:      respirations unlabored  Heart:    Normal heart rate.    MS:   Amputation noted  Neurologic:   Awake, alert       Data Reviewed: I have personally reviewed following labs and imaging studies  CBC: Recent Labs  Lab 04/17/23 0711 04/18/23 0456 04/19/23 0409 04/20/23 0359 04/21/23 0350 04/22/23  0338  WBC 9.7 14.8* 11.3* 8.0 5.6 6.0  NEUTROABS 6.9  --   --   --   --   --   HGB 12.6 12.0 11.0* 9.9* 9.9* 10.3*  HCT 40.2 37.8 35.3* 32.3* 32.0* 32.2*  MCV 97.3 97.9 99.2 99.4 97.9 93.9  PLT 241 236 218 225 232 252   Basic Metabolic Panel: Recent Labs  Lab 04/17/23 0711 04/18/23 0456 04/19/23 0409 04/20/23 0359 04/22/23 0338  NA 136 134* 132* 136 138  K 4.1 4.5 4.3 4.0 3.9  CL 102 100 102 105 106  CO2 26 22 20* 21* 24  GLUCOSE 110* 80 121* 94 101*  BUN 19 17 22  24* 19  CREATININE 0.64 0.70 0.91 0.47 0.60  CALCIUM 8.9 9.0 8.4* 8.6* 9.0   GFR: Estimated Creatinine Clearance:  81.2 mL/min (by C-G formula based on SCr of 0.6 mg/dL). Liver Function Tests: Recent Labs  Lab 04/17/23 0711  AST 18  ALT 35  ALKPHOS 21*  BILITOT 2.0*  PROT 6.4*  ALBUMIN 3.2*   No results for input(s): "LIPASE", "AMYLASE" in the last 168 hours. No results for input(s): "AMMONIA" in the last 168 hours. Coagulation Profile: No results for input(s): "INR", "PROTIME" in the last 168 hours. Cardiac Enzymes: No results for input(s): "CKTOTAL", "CKMB", "CKMBINDEX", "TROPONINI" in the last 168 hours. BNP (last 3 results) No results for input(s): "PROBNP" in the last 8760 hours. HbA1C: No results for input(s): "HGBA1C" in the last 72 hours. CBG: No results for input(s): "GLUCAP" in the last 168 hours. Lipid Profile: No results for input(s): "CHOL", "HDL", "LDLCALC", "TRIG", "CHOLHDL", "LDLDIRECT" in the last 72 hours. Thyroid Function Tests: No results for input(s): "TSH", "T4TOTAL", "FREET4", "T3FREE", "THYROIDAB" in the last 72 hours. Anemia Panel: No results for input(s): "VITAMINB12", "FOLATE", "FERRITIN", "TIBC", "IRON", "RETICCTPCT" in the last 72 hours. Sepsis Labs: No results for input(s): "PROCALCITON", "LATICACIDVEN" in the last 168 hours.  Recent Results (from the past 240 hours)  SARS Coronavirus 2 by RT PCR (hospital order, performed in [facility] hospital lab) *cepheid single result test* Anterior Nasal Swab     Status: None   Collection Time: 04/20/23  9:40 AM   Specimen: Anterior Nasal Swab  Result Value Ref Range Status   SARS Coronavirus 2 by RT PCR NEGATIVE NEGATIVE Final    Comment: (NOTE) SARS-CoV-2 target nucleic acids are NOT DETECTED.  The SARS-CoV-2 RNA is generally detectable in upper and lower respiratory specimens during the acute phase of infection. The lowest concentration of SARS-CoV-2 viral copies this assay can detect is 250 copies / mL. A negative result does not preclude SARS-CoV-2 infection and should not be used as the sole basis for  treatment or other patient management decisions.  A negative result may occur with improper specimen collection / handling, submission of specimen other than nasopharyngeal swab, presence of viral mutation(s) within the areas targeted by this assay, and inadequate number of viral copies (<250 copies / mL). A negative result must be combined with clinical observations, patient history, and epidemiological information.  Fact Sheet for Patients:   RoadLapTop.co.za  Fact Sheet for Healthcare Providers: http://kim-miller.com/  This test is not yet approved or  cleared by the [place] FDA and has been authorized for detection and/or diagnosis of SARS-CoV-2 by FDA under an Emergency Use Authorization (EUA).  This EUA will remain in effect (meaning this test can be used) for the duration of the COVID-19 declaration under Section 564(b)(1) of the Act, 21 U.S.C. section  360bbb-3(b)(1), unless the authorization is terminated or revoked sooner.  Performed at [facility], [address] [place], [place] [address]          Radiology Studies: DG ESOPHAGUS W SINGLE CM (SOL OR THIN BA) Result Date: 04/23/2023 CLINICAL DATA:  Provided history: GERD (gastroesophageal reflux disease). EXAM: ESOPHOGRAM/BARIUM SWALLOW TECHNIQUE: A single contrast examination was performed using thin barium. Additionally, the patient swallowed 13 mm barium tablet under fluoroscopy. FLUOROSCOPY: Radiation Exposure Index (as provided by the fluoroscopic device): 44.70 mGy Kerma COMPARISON:  Chest CT 04/17/2023. FINDINGS: Fluoroscopic evaluation demonstrates normal caliber and smooth contour of the esophagus. No evidence of fixed stricture, mass or mucosal abnormality. Moderate intermittent esophageal dysmotility with tertiary contractions and mildly delayed esophageal clearance. Correlating with the chest CT of 04/17/2023, there is a small to moderate-sized  hiatal hernia. No gastroesophageal reflux observed. The patient swallowed a 13 mm barium tablet, which traversed the esophagus and passed into the stomach without significant delay. IMPRESSION: 1. Small to moderate-sized hiatal hernia. 2. Moderate esophageal dysmotility with tertiary contractions and mildly delayed esophageal clearance. 3. Otherwise unremarkable esophagram, as described. Electronically Signed   By: Jackey Loge D.O.   On: 04/23/2023 09:17         Scheduled Meds:  apixaban  10 mg Oral BID   Followed by   Melene Muller ON 04/28/2023] apixaban  5 mg Oral BID   feeding supplement  237 mL Oral BID BM   fluticasone  1 spray Each Nare Daily   loratadine  10 mg Oral Daily   mirtazapine  15 mg Oral QHS   pantoprazole (PROTONIX) IV  40 mg Intravenous QHS   Continuous Infusions:     LOS: 6 days    Time spent: 45 minutes spent on chart review, discussion with nursing staff, consultants, updating family and interview/physical exam; more than 50% of that time was spent in counseling and/or coordination of care.    Joseph Art, DO [facility] Available via Epic secure chat 7am-7pm After these hours, please refer to coverage provider listed on [email or web address] 04/23/2023, 12:41 PM

## 2023-04-23 NOTE — Telephone Encounter (Signed)
 OP pulmonology follow up requested in 1 month.  Steffanie Dunn, DO 04/23/23 9:44 AM [facility]  For contact information, see Amion. If no response to pager, please call PCCM consult pager. After hours, 7PM- 7AM, please call Elink.

## 2023-04-23 NOTE — Discharge Instructions (Signed)
 Information on my medicine - ELIQUIS (apixaban)  This medication education was reviewed with me or my healthcare representative as part of my discharge preparation.  The pharmacist that spoke with me during my hospital stay was:  Tory Emerald, Student-PharmD  Why was Eliquis prescribed for you? Eliquis was prescribed to treat blood clots that may have been found in the veins of your legs (deep vein thrombosis) or in your lungs (pulmonary embolism) and to reduce the risk of them occurring again.  What do You need to know about Eliquis ? The starting dose is 10 mg (two 5 mg tablets) taken TWICE daily for the FIRST SEVEN (7) DAYS, then on (enter date)  04/28/2023  the dose is reduced to ONE 5 mg tablet taken TWICE daily.  Eliquis may be taken with or without food.   Try to take the dose about the same time in the morning and in the evening. If you have difficulty swallowing the tablet whole please discuss with your pharmacist how to take the medication safely.  Take Eliquis exactly as prescribed and DO NOT stop taking Eliquis without talking to the doctor who prescribed the medication.  Stopping may increase your risk of developing a new blood clot.  Refill your prescription before you run out.  After discharge, you should have regular check-up appointments with your healthcare provider that is prescribing your Eliquis.    What do you do if you miss a dose? If a dose of ELIQUIS is not taken at the scheduled time, take it as soon as possible on the same day and twice-daily administration should be resumed. The dose should not be doubled to make up for a missed dose.  Important Safety Information A possible side effect of Eliquis is bleeding. You should call your healthcare provider right away if you experience any of the following: Bleeding from an injury or your nose that does not stop. Unusual colored urine (red or dark brown) or unusual colored stools (red or black). Unusual bruising  for unknown reasons. A serious fall or if you hit your head (even if there is no bleeding).  Some medicines may interact with Eliquis and might increase your risk of bleeding or clotting while on Eliquis. To help avoid this, consult your healthcare provider or pharmacist prior to using any new prescription or non-prescription medications, including herbals, vitamins, non-steroidal anti-inflammatory drugs (NSAIDs) and supplements.  This website has more information on Eliquis (apixaban): http://www.eliquis.com/eliquis/home

## 2023-04-23 NOTE — Progress Notes (Signed)
 Occupational Therapy Treatment Patient Details Name: Shannon Henderson MRN: [id number] DOB: April 17, 1955 Today's Date: 04/23/2023   History of present illness Patinet is a 68 year old female who was admitted with acute bil PE. Hx of R BKA, CRPS, migraines, osteopenia, rib fxs, pneumothorax   OT comments  Patient is motivated to get back home and hopefully to work soon. Patient was able to complete toileting tasks and standing at sink for grooming with supervision with forearm crutch support. Patient was provided with yellow theraband for extra strengthening exercises to complete in additional to ADLs. Patient verbalized and demonstrated understanding to completing these along with chair push ups. Patient's discharge plan remains appropriate at this time. OT will continue to follow acutely.        If plan is discharge home, recommend the following:  A little help with walking and/or transfers;A little help with bathing/dressing/bathroom;Help with stairs or ramp for entrance   Equipment Recommendations  None recommended by OT       Precautions / Restrictions Precautions Precautions: Fall Precaution/Restrictions Comments: monitor O2, HR Required Braces or Orthoses:  (R prosthesis-BKA) Restrictions Weight Bearing Restrictions Per Provider Order: No       Mobility Bed Mobility               General bed mobility comments: patient was up in recliner and returned to the same after session.       Balance Overall balance assessment: Needs assistance   Sitting balance-Leahy Scale: Good     Standing balance support: No upper extremity supported, During functional activity Standing balance-Leahy Scale: Fair         ADL either performed or assessed with clinical judgement   ADL Overall ADL's : Needs assistance/impaired     Grooming: Standing;Wash/dry face;Wash/dry hands;Supervision/safety Grooming Details (indicate cue type and reason): with patient leaning on sink to [employer]  balance               Lower Body Dressing Details (indicate cue type and reason): patient reported having gotten on her prostesis herself this AM. Toilet Transfer: Radiographer, therapeutic Details (indicate cue type and reason): with forearm canes with supervision around room with no LOB. patient reported normally walking with no AD Toileting- Clothing Manipulation and Hygiene: Supervision/safety;Sit to/from stand Toileting - Clothing Manipulation Details (indicate cue type and reason): on commode with 3in 1 over top              Cognition Arousal: Alert Behavior During Therapy: WFL for tasks assessed/performed Cognition: No apparent impairments                          Pertinent Vitals/ Pain       Pain Assessment Pain Assessment: 0-10 Pain Score: 3  Pain Location: back on R side mid thoracic Pain Descriptors / Indicators: Discomfort Pain Intervention(s): Limited activity within patient's tolerance, Monitored during session         Frequency  Min 2X/week        Progress Toward Goals  OT Goals(current goals can now be found in the care plan section)  Progress towards OT goals: Progressing toward goals     Plan         AM-PAC OT "6 Clicks" Daily Activity     Outcome Measure   Help from another person eating meals?: None Help from another person taking care of personal grooming?: None Help from another person toileting, which includes using toliet, bedpan, or urinal?: A  Little Help from another person bathing (including washing, rinsing, drying)?: A Little Help from another person to put on and taking off regular upper body clothing?: None Help from another person to put on and taking off regular lower body clothing?: A Little 6 Click Score: 21    End of Session Equipment Utilized During Treatment: Gait belt;Other (comment) (forearm crutches.)  OT Visit Diagnosis: Unsteadiness on feet (R26.81);Other abnormalities of gait and mobility  (R26.89);Muscle weakness (generalized) (M62.81)   Activity Tolerance Patient tolerated treatment well   Patient Left in chair;with call bell/phone within reach   Nurse Communication Mobility status        Time: 1610-9604 OT Time Calculation (min): 23 min  Charges: OT General Charges $OT Visit: 1 Visit OT Treatments $Self Care/Home Management : 23-37 mins  Rosalio Loud, MS Acute Rehabilitation Department Office# [phone]   Selinda Flavin 04/23/2023, 10:46 AM

## 2023-04-23 NOTE — Progress Notes (Signed)
 NAME:  Shannon Henderson, MRN:  [id number], DOB:  1955/07/30, LOS: 6 ADMISSION DATE:  04/17/2023, CONSULTATION DATE: 04/20/2023 REFERRING MD: Dr. Benjamine Mola, CHIEF COMPLAINT: Shortness of breath, pulmonary embolism  History of Present Illness:  39 yoF w/ PMH of R BKA 2/2 CRPS who presented 4/1 with acute onset of severe right rib/back pain with inspiratory pleuritic pain/ SOB and ongoing dry cough for several months admitted to [facility] with acute BLL subsegmental and segmental PE without evidence of right heart strain.  Echo revealed hyperdynamic EF 70-75%, G2DD, normal RV and normal PASP.  LE dopplers negative for DVT.  SARS/ flu/ RSV neg.  BNP 98, neg troponin hs.  Initial tmax 101.7 with WBC 14.8, empiric CAP coverage started.  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  Recently returned from [place] on 3/18 and remains very active, played pickleball as most recently of 3/31.  Never smoker.  No family hx of clotting disorders, no LE pain/ swelling, no dizziness/ syncope, and reports is up to date on cancer screenings, no unintentional weight loss.  Treated with heparin gtt however pt remains largely symptomatic with fatigue and exertional dyspnea requiring 2 person assist to [facility] and requiring 2L O2.  Additionally, patient has been seen outpatient recently for development of chronic cough since November.  Reports hx of snoring, no prior sleep study.  Started wearing a snore guard in October.  Has helped with her snoring but since has had ongoing mostly dry clear cough and poor sleep due to coughing spells.  Previous concerns for sinus infection with nasal congestion and sore throat over several weeks, treated with abx and steroids but no significant improvement per patient.   Denies reflux symptoms.  Given lack of improvement with treatment of PE with ongoing dry cough, fatigue and SOB, PCCM consulted for further input.   Pertinent  Medical  History  Never smoker, right BKA-from chronic pain Migraines Osteopenia  Significant Hospital Events: Including procedures, antibiotic start and stop dates in addition to other pertinent events   4/1-admitted with acute PE  Interim History / Subjective:  Feels unsteady, still has some exertional dyspnea. Cough and inability to talk are worse than average.   Objective   Blood pressure (!) 154/68, pulse 83, temperature 99.3 F (37.4 C), temperature source Oral, resp. rate 20, height 6\' 1"  (1.854 m), weight 78.5 kg, SpO2 91%.        Intake/Output Summary (Last 24 hours) at 04/23/2023 0746 Last data filed at 04/22/2023 0900 Gross per 24 hour  Intake 120 ml  Output --  Net 120 ml   Filed Weights   04/17/23 0640  Weight: 78.5 kg    Examination: General: elderly woman sitting in the chair in NAD HENT: [place]/AT, eyes anicteric.  Lungs: breathing comfortably on RA, CTAB. No observed coughing during encounter. Cardiovascular: S1S2, RRR Abdomen: nondistended  Echo: LVEF 70-75%, normal RV size and function. Normal IVC size and variability.  CT chest personally reviewed  Resolved Hospital Problem list     Assessment & Plan:   Acute pulmonary embolism- bilateral lower lobes. Likely provoked by recent travel to and from [place] in Feb & March.  -DOAC. Minimum of at least 3 months of anticoagulation recommended for provoked PE. -Needs age appropriate cancer screening as OP -Thrombophilia screening sent (Protein C, protein S and Antithrombin III are most affected by current anticoagulation & these need to be tested while off anticoagulation) -pending studies: lupus anticoagulant, anticardiolipin,  beta-2 glycoprotein antibodies, factor V Leiden, prothrombin gene mutation. These would not change immediate recommendations.  Likely OSA -needs OP PSG (ordered) -recommend OP follow up with Dr. Wynona Neat.  Chronic cough, groundglass opacification at bases, worse on the left. May be related to  chronic aspiration -Aspiration precautions-elevating head of bed at night -Allowing stomach to empty before bedtime, avoid large meals or high fat containing meals late in the day. Caffeine limited and only early in the day. Avoid mint & carbonated beverages. Maintain normal BMI.  -Will benefit from esophagogram (Patient recollects over 30 years ago having some barium study done for dysphagia). Con't PPI; prescribe at discharge. -Recommend against long-term use of chronic opiates for cough control -Adding allergy meds that may help her more acute worsening of her chronic cough- daily flonase and claritin.  OP follow up requested in 1 month.  PCCM will be available as needed. Please call with questions.  Steffanie Dunn, DO 04/23/23 7:54 AM [facility]  For contact information, see Amion. If no response to pager, please call PCCM consult pager. After hours, 7PM- 7AM, please call Elink.

## 2023-04-23 NOTE — Progress Notes (Signed)
 Physical Therapy Treatment Patient Details Name: Shannon Henderson MRN: [id number] DOB: 1955-02-10 Today's Date: 04/23/2023   History of Present Illness 68 yo female admitted with acute bil PE. Hx of R BKA, CRPS, migraines, osteopenia, rib fxs, pneumothorax    PT Comments  Pt demonstrates significant improvement in functional mobility this date. Pt amb 350 ft with loftstrand/forearm crutches, no dyspnea noted, conversational, pleasant, motivated to return home and train her horse. Pt on RA with SpO2 98% and HR 108 noted at end of ambulation. Anticipate no post acute f/u PT.   If plan is discharge home, recommend the following: Assistance with cooking/housework;Assist for transportation   Can travel by private vehicle        Equipment Recommendations  None recommended by PT    Recommendations for Other Services       Precautions / Restrictions Precautions Precautions: Fall Precaution/Restrictions Comments: monitor O2, HR Required Braces or Orthoses:  (R prosthesis-BKA) Restrictions Weight Bearing Restrictions Per Provider Order: No     Mobility  Bed Mobility               General bed mobility comments: in recliner upon arrival    Transfers Overall transfer level: Needs assistance Equipment used: Lofstrands Transfers: Sit to/from Stand Sit to Stand: Supervision           General transfer comment: supv with loftstrand/forearm crutches    Ambulation/Gait Ambulation/Gait assistance: Supervision Gait Distance (Feet): 350 Feet Assistive device: Lofstrands Gait Pattern/deviations: WFL(Within Functional Limits) Gait velocity: WFL     General Gait Details: step through gait pattern with loftstrand/forearm crutches, good steadiness, on RA with Spo2 98% and HR up to 108 max, no dyspnea noted, conversational and pleasant   Stairs             Wheelchair Mobility     Tilt Bed    Modified Rankin (Stroke Patients Only)       Balance            Standing balance support: During functional activity Standing balance-Leahy Scale: Fair Standing balance comment: able to stand without UE support while positioning loftstrand/forearm crutches                            Communication Communication Communication: No apparent difficulties  Cognition Arousal: Alert Behavior During Therapy: WFL for tasks assessed/performed   PT - Cognitive impairments: No apparent impairments                         Following commands: Intact      Cueing Cueing Techniques: Verbal cues  Exercises      General Comments        Pertinent Vitals/Pain Pain Assessment Pain Assessment: No/denies pain    Home Living                          Prior Function            PT Goals (current goals can now be found in the care plan section) Acute Rehab PT Goals Patient Stated Goal: regain strength. PT Goal Formulation: With patient Time For Goal Achievement: 05/05/23 Potential to Achieve Goals: Good Progress towards PT goals: Progressing toward goals    Frequency    Min 2X/week      PT Plan      Co-evaluation  AM-PAC PT "6 Clicks" Mobility   Outcome Measure  Help needed turning from your back to your side while in a flat bed without using bedrails?: None Help needed moving from lying on your back to sitting on the side of a flat bed without using bedrails?: None Help needed moving to and from a bed to a chair (including a wheelchair)?: None Help needed standing up from a chair using your arms (e.g., wheelchair or bedside chair)?: None Help needed to walk in hospital room?: A Little Help needed climbing 3-5 steps with a railing? : A Little 6 Click Score: 22    End of Session Equipment Utilized During Treatment: Gait belt Activity Tolerance: Patient tolerated treatment well Patient left: in chair;with call bell/phone within reach Nurse Communication: Mobility status;Other (comment) (HR  and SpO2) PT Visit Diagnosis: Muscle weakness (generalized) (M62.81);Difficulty in walking, not elsewhere classified (R26.2)     Time: 4098-1191 PT Time Calculation (min) (ACUTE ONLY): 16 min  Charges:    $Gait Training: 8-22 mins PT General Charges $$ ACUTE PT VISIT: 1 Visit                     Tori Malani Lees PT, DPT 04/23/23, 11:20 AM

## 2023-04-24 ENCOUNTER — Other Ambulatory Visit (HOSPITAL_COMMUNITY): Payer: Self-pay

## 2023-04-24 DIAGNOSIS — I2699 Other pulmonary embolism without acute cor pulmonale: Secondary | ICD-10-CM | POA: Diagnosis not present

## 2023-04-24 DIAGNOSIS — R053 Chronic cough: Secondary | ICD-10-CM | POA: Diagnosis not present

## 2023-04-24 DIAGNOSIS — G4733 Obstructive sleep apnea (adult) (pediatric): Secondary | ICD-10-CM | POA: Diagnosis not present

## 2023-04-24 MED ORDER — PANTOPRAZOLE SODIUM 40 MG PO TBEC: 40.0000 mg | DELAYED_RELEASE_TABLET | Freq: Every day | ORAL | 1 refills | Status: DC

## 2023-04-24 MED ORDER — APIXABAN (ELIQUIS) VTE STARTER PACK (10MG AND 5MG): ORAL_TABLET | ORAL | 0 refills | Status: DC

## 2023-04-24 MED ORDER — HYDROCODONE BIT-HOMATROP MBR 5-1.5 MG/5ML PO SOLN: 5.0000 mL | Freq: Four times a day (QID) | ORAL | 0 refills | Status: DC | PRN

## 2023-04-24 MED ORDER — APIXABAN (ELIQUIS) VTE STARTER PACK (10MG AND 5MG)
ORAL_TABLET | ORAL | 0 refills | Status: DC
  Filled 2023-04-24: qty 74, 30d supply, fill #0

## 2023-04-24 MED ORDER — APIXABAN 5 MG PO TABS: 5.0000 mg | ORAL_TABLET | Freq: Two times a day (BID) | ORAL | 1 refills | Status: DC

## 2023-04-24 NOTE — Progress Notes (Signed)
 PIV removed as noted x 2 - pt removed from tele - central tele called by this RN to notify of pt discharge

## 2023-04-24 NOTE — Discharge Summary (Addendum)
 Physician Discharge Summary  Shannon Henderson ZOX:[id number] DOB: July 17, 1955 DOA: 04/17/2023  PCP: System, Provider Not In  Admit date: 04/17/2023 Discharge date: 04/24/2023  Admitted From: home Discharge disposition: home   Recommendations for Outpatient Follow-Up:   Labs pending for hypercoagulable work up: Protein C, protein S and Antithrombin III are most affected by current anticoagulation & these need to be tested while off anticoagulation)-pending studies: lupus anticoagulant, anticardiolipin, beta-2 glycoprotein antibodies, factor V Leiden, prothrombin gene mutation. Close follow up with GI and pulm Outpatient sleep study   Discharge Diagnosis:   Principal Problem:   Acute pulmonary embolism (HCC)    Discharge Condition: Improved.  Diet recommendation:  Regular.  Wound care: None.  Code status: Full.   History of Present Illness:   Shannon Henderson is a 68 y.o. 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  History is provided by the patient as well as her husband who is at the bedside, they state that she has had a chronic nonproductive cough for the last month or 2, without sputum production, chest pain, fevers or chills.  This morning, she had sudden onset of severe low back pain, worse with inspiration.  Workup as detailed below shows evidence of acute bilateral and subsegmental PE.  On further discussion, patient states she just returned back about a week ago from driving down to [place] and back.  She denies any lower extremity swelling or pain.    Hospital Course by Problem:   Acute bilateral PE-without evidence of heart strain, though she does have some mild tachycardia and asymptomatic hypotension.  Initially she was hypertensive, hypotension may be related to narcotics which she  received.  I suspect this is provoked by her recent long drive to [place] and back.  Patient and wife state that she has had appropriate cancer screening including mammograms and colonoscopy.  Denies any recent worrisome symptoms such as weight loss, chronic pain, nausea etc. - IV heparin drip- changed to PO eliquis -echo- done with grade 2 diastolic-- BNP elevated-- dose of IV lasix with good results- outpatient follow up -lower extremity Dopplers negative -Pain controlled -treated with abx     Acute hypoxic resp failure -off O2 today   Dry cough X several months -? GERD per pulm-- protonix added -will need to follow with Dr. Lavonia Drafts -esophagram ordered and done for outpatient follow up -allergy medication added 4/7   Nausea -PRN anti-emetics  -resolved    Medical Consultants:     Pulm  Discharge Exam:   Vitals:   04/23/23 2052 04/24/23 0607  BP: (!) 147/67 134/73  Pulse: 90 78  Resp: 20 16  Temp: 99 F (37.2 C) 99 F (37.2 C)  SpO2: 93% 95%   Vitals:   04/23/23 1050 04/23/23 1340 04/23/23 2052 04/24/23 0607  BP: (!) 129/54 (!) 157/69 (!) 147/67 134/73  Pulse: 98 96 90 78  Resp: 18 20 20 16   Temp: 98 F (36.7 C) 98.2 F (36.8 C) 99 F (37.2 C) 99 F (37.2 C)  TempSrc: Oral Oral Oral   SpO2: 98% 98% 93% 95%  Weight:      Height:        General exam: Appears calm and comfortable.    The results of significant diagnostics from this hospitalization (including imaging, microbiology, ancillary and laboratory) are listed  below for reference.     Procedures and Diagnostic Studies:   VAS [place] LOWER EXTREMITY VENOUS (DVT) Result Date: 04/18/2023  Lower Venous DVT Study Patient Name:  Shannon Henderson  Date of Exam:   04/18/2023 Medical Rec #: [id number]         Accession #:    [id number] Date of Birth: 1955-08-10          Patient Gender: F Patient Age:   89 years Exam Location:  [facility] Procedure:      VAS [place] LOWER EXTREMITY VENOUS (DVT) Referring Phys:  MIR [facility] --------------------------------------------------------------------------------  Indications: Pulmonary embolism.  Risk Factors: Recent extended travel. Comparison Study: No previous exams Performing Technologist: Jody Hill RVT, RDMS  Examination Guidelines: A complete evaluation includes B-mode imaging, spectral Doppler, color Doppler, and power Doppler as needed of all accessible portions of each vessel. Bilateral testing is considered an integral part of a complete examination. Limited examinations for reoccurring indications may be performed as noted. The reflux portion of the exam is performed with the patient in reverse Trendelenburg.  +-----+---------------+---------+-----------+----------+--------------+ RIGHTCompressibilityPhasicitySpontaneityPropertiesThrombus Aging +-----+---------------+---------+-----------+----------+--------------+ CFV  Full           Yes      Yes                                 +-----+---------------+---------+-----------+----------+--------------+   +---------+---------------+---------+-----------+----------+--------------+ LEFT     CompressibilityPhasicitySpontaneityPropertiesThrombus Aging +---------+---------------+---------+-----------+----------+--------------+ CFV      Full           Yes      Yes                                 +---------+---------------+---------+-----------+----------+--------------+ SFJ      Full                                                        +---------+---------------+---------+-----------+----------+--------------+ FV Prox  Full           Yes      Yes                                 +---------+---------------+---------+-----------+----------+--------------+ FV Mid   Full           Yes      Yes                                 +---------+---------------+---------+-----------+----------+--------------+ FV DistalFull           Yes      Yes                                  +---------+---------------+---------+-----------+----------+--------------+ PFV      Full                                                        +---------+---------------+---------+-----------+----------+--------------+ POP  Full           Yes      Yes                                 +---------+---------------+---------+-----------+----------+--------------+ PTV      Full                                                        +---------+---------------+---------+-----------+----------+--------------+ PERO     Full                                                        +---------+---------------+---------+-----------+----------+--------------+    Summary: RIGHT: - No evidence of common femoral vein obstruction.   LEFT: - There is no evidence of deep vein thrombosis in the lower extremity.  - No cystic structure found in the popliteal fossa.  *See table(s) above for measurements and observations. Electronically signed by Coral Else MD on 04/18/2023 at 6:58:38 PM.    Final    ECHOCARDIOGRAM COMPLETE Result Date: 04/17/2023    ECHOCARDIOGRAM REPORT   Patient Name:   IRISHA GRANDMAISON Date of Exam: 04/17/2023 Medical Rec #:  [id number]        Height:       73.0 in Accession #:    [id number]       Weight:       173.0 lb Date of Birth:  11-04-1955         BSA:          2.023 m Patient Age:    68 years         BP:           99/73 mmHg Patient Gender: F                HR:           91 bpm. Exam Location:  Inpatient Procedure: 2D Echo, Cardiac Doppler and Color Doppler (Both Spectral and Color            Flow Doppler were utilized during procedure). Indications:    Pulmonary embolus  History:        Patient has no prior history of Echocardiogram examinations.  Sonographer:    Vern Claude Referring Phys: [id number] MIR [facility] IMPRESSIONS  1. Left ventricular ejection fraction, by estimation, is 70 to 75%. The left ventricle has hyperdynamic function. The left ventricle has no regional wall  motion abnormalities. Left ventricular diastolic parameters are consistent with Grade II diastolic dysfunction (pseudonormalization).  2. Right ventricular systolic function is normal. The right ventricular size is normal. There is normal pulmonary artery systolic pressure.  3. The mitral valve is normal in structure. Trivial mitral valve regurgitation. No evidence of mitral stenosis.  4. The aortic valve is normal in structure. Aortic valve regurgitation is not visualized. No aortic stenosis is present.  5. The inferior vena cava is normal in size with greater than 50% respiratory variability, suggesting right atrial pressure of 3 mmHg. FINDINGS  Left Ventricle: Left ventricular ejection fraction, by estimation, is 70 to 75%. The left ventricle has hyperdynamic  function. The left ventricle has no regional wall motion abnormalities. The left ventricular internal cavity size was normal in size. There is no left ventricular hypertrophy. Left ventricular diastolic parameters are consistent with Grade II diastolic dysfunction (pseudonormalization). Right Ventricle: The right ventricular size is normal. No increase in right ventricular wall thickness. Right ventricular systolic function is normal. There is normal pulmonary artery systolic pressure. The tricuspid regurgitant velocity is 1.47 m/s, and  with an assumed right atrial pressure of 3 mmHg, the estimated right ventricular systolic pressure is 11.6 mmHg. Left Atrium: Left atrial size was normal in size. Right Atrium: Right atrial size was normal in size. Pericardium: There is no evidence of pericardial effusion. Mitral Valve: The mitral valve is normal in structure. Trivial mitral valve regurgitation. No evidence of mitral valve stenosis. MV peak gradient, 3.8 mmHg. The mean mitral valve gradient is 2.0 mmHg. Tricuspid Valve: The tricuspid valve is normal in structure. Tricuspid valve regurgitation is trivial. No evidence of tricuspid stenosis. Aortic Valve: The  aortic valve is normal in structure. Aortic valve regurgitation is not visualized. No aortic stenosis is present. Aortic valve mean gradient measures 5.0 mmHg. Aortic valve peak gradient measures 8.6 mmHg. Aortic valve area, by VTI measures 2.70 cm. Pulmonic Valve: The pulmonic valve was grossly normal. Pulmonic valve regurgitation is not visualized. No evidence of pulmonic stenosis. Aorta: The aortic root is normal in size and structure. Venous: The inferior vena cava is normal in size with greater than 50% respiratory variability, suggesting right atrial pressure of 3 mmHg. IAS/Shunts: No atrial level shunt detected by color flow Doppler.  LEFT VENTRICLE PLAX 2D LVIDd:         3.60 cm      Diastology LVIDs:         2.50 cm      LV e' medial:    7.51 cm/s LV PW:         0.70 cm      LV E/e' medial:  13.2 LV IVS:        0.70 cm      LV e' lateral:   8.70 cm/s LVOT diam:     1.80 cm      LV E/e' lateral: 11.4 LV SV:         63 LV SV Index:   31 LVOT Area:     2.54 cm  LV Volumes (MOD) LV vol d, MOD A2C: 93.6 ml LV vol d, MOD A4C: 109.0 ml LV vol s, MOD A2C: 24.9 ml LV vol s, MOD A4C: 29.5 ml LV SV MOD A2C:     68.7 ml LV SV MOD A4C:     109.0 ml LV SV MOD BP:      75.2 ml RIGHT VENTRICLE             IVC RV Basal diam:  3.10 cm     IVC diam: 1.00 cm RV Mid diam:    3.10 cm RV S prime:     28.10 cm/s TAPSE (M-mode): 2.4 cm LEFT ATRIUM             Index        RIGHT ATRIUM           Index LA diam:        2.40 cm 1.19 cm/m   RA Area:     11.00 cm LA Vol (A2C):   30.9 ml 15.27 ml/m  RA Volume:   21.60 ml  10.68 ml/m LA Vol (A4C):  23.8 ml 11.76 ml/m LA Biplane Vol: 29.6 ml 14.63 ml/m  AORTIC VALVE                     PULMONIC VALVE AV Area (Vmax):    2.42 cm      PV Vmax:       0.95 m/s AV Area (Vmean):   2.68 cm      PV Peak grad:  3.6 mmHg AV Area (VTI):     2.70 cm AV Vmax:           147.00 cm/s AV Vmean:          94.900 cm/s AV VTI:            0.232 m AV Peak Grad:      8.6 mmHg AV Mean Grad:      5.0 mmHg  LVOT Vmax:         140.00 cm/s LVOT Vmean:        100.000 cm/s LVOT VTI:          0.246 m LVOT/AV VTI ratio: 1.06  AORTA Ao Root diam: 3.60 cm Ao Asc diam:  2.50 cm MITRAL VALVE               TRICUSPID VALVE MV Area (PHT): 3.19 cm    TR Peak grad:   8.6 mmHg MV Area VTI:   2.66 cm    TR Vmax:        147.00 cm/s MV Peak grad:  3.8 mmHg MV Mean grad:  2.0 mmHg    SHUNTS MV Vmax:       0.97 m/s    Systemic VTI:  0.25 m MV Vmean:      68.9 cm/s   Systemic Diam: 1.80 cm MV Decel Time: 238 msec MV E velocity: 99.20 cm/s MV A velocity: 75.70 cm/s MV E/A ratio:  1.31 Arvilla Meres MD Electronically signed by Arvilla Meres MD Signature Date/Time: 04/17/2023/2:57:57 PM    Final    CT Angio Chest PE W and/or Wo Contrast Result Date: 04/17/2023 CLINICAL DATA:  Cough and back pain.  Difficulty breathing. EXAM: CT ANGIOGRAPHY CHEST WITH CONTRAST TECHNIQUE: Multidetector CT imaging of the chest was performed using the standard protocol during bolus administration of intravenous contrast. Multiplanar CT image reconstructions and MIPs were obtained to evaluate the vascular anatomy. RADIATION DOSE REDUCTION: This exam was performed according to the departmental dose-optimization program which includes automated exposure control, adjustment of the mA and/or kV according to patient size and/or use of iterative reconstruction technique. CONTRAST:  75mL OMNIPAQUE IOHEXOL 350 MG/ML SOLN COMPARISON:  None Available. FINDINGS: Cardiovascular: The heart size is normal. No substantial pericardial effusion. No thoracic aortic aneurysm. No substantial atherosclerosis of the thoracic aorta. Nonocclusive thrombus is identified in segmental and subsegmental pulmonary arteries to the right lower lobe (see image 186/12). There is also segmental and subsegmental pulmonary embolus to the left lower lobe. RV/LV ratio is 0.86. Mediastinum/Nodes: No mediastinal lymphadenopathy. There is no hilar lymphadenopathy. Esophagus is patulous. Small to  moderate hiatal hernia. There is no axillary lymphadenopathy. Lungs/Pleura: No suspicious pulmonary nodule or mass. Small focus of peripheral consolidative opacity in the anterior left lower lobe may reflect infarct. There is patchy ground-glass opacity in the lung bases that could be atelectasis or evolving infarct. No pleural effusion. Upper Abdomen: Visualized portion of the upper abdomen shows no acute findings. Musculoskeletal: No worrisome lytic or sclerotic osseous abnormality. Review of the MIP images confirms the above findings.  IMPRESSION: 1. Positive for bilateral lower lobe segmental and subsegmental pulmonary emboli. No evidence for right heart strain. Small focus of peripheral consolidative opacity in the anterior left lower lobe may reflect infarct. 2. Patchy ground-glass opacity in the lung bases could be atelectasis or evolving infarct. 3. Small to moderate hiatal hernia. Critical Value/emergent results were called by telephone at the time of interpretation on 04/17/2023 at 11:05 am to provider Dr. Donnald Garre, who verbally acknowledged these results. Electronically Signed   By: Kennith Center M.D.   On: 04/17/2023 11:08   DG Chest Portable 1 View Result Date: 04/17/2023 CLINICAL DATA:  68 year old female with increasing cough, shortness of breath. EXAM: PORTABLE CHEST 1 VIEW COMPARISON:  Chest radiographs 05/07/2017 and earlier. FINDINGS: Portable AP semi upright view at 0810 hours. Background large lung volumes, low lung volumes now, and the patient is rotated to the left. Accentuation of cardiac and mediastinal contours. Visualized tracheal air column is within normal limits. No pneumothorax or pleural effusion. Bibasilar hypo ventilation. No air bronchograms. Normal pulmonary vasculature in the aerated lung. Paucity of bowel gas.  No acute osseous abnormality identified. IMPRESSION: Low lung volumes with bibasilar hypoventilation. Follow-up PA and lateral views of the chest would be helpful when  feasible. Electronically Signed   By: Odessa Fleming M.D.   On: 04/17/2023 09:00     Labs:   Basic Metabolic Panel: Recent Labs  Lab 04/18/23 0456 04/19/23 0409 04/20/23 0359 04/22/23 0338  NA 134* 132* 136 138  K 4.5 4.3 4.0 3.9  CL 100 102 105 106  CO2 22 20* 21* 24  GLUCOSE 80 121* 94 101*  BUN 17 22 24* 19  CREATININE 0.70 0.91 0.47 0.60  CALCIUM 9.0 8.4* 8.6* 9.0   GFR Estimated Creatinine Clearance: 81.2 mL/min (by C-G formula based on SCr of 0.6 mg/dL). Liver Function Tests: No results for input(s): "AST", "ALT", "ALKPHOS", "BILITOT", "PROT", "ALBUMIN" in the last 168 hours. No results for input(s): "LIPASE", "AMYLASE" in the last 168 hours. No results for input(s): "AMMONIA" in the last 168 hours. Coagulation profile No results for input(s): "INR", "PROTIME" in the last 168 hours.  CBC: Recent Labs  Lab 04/18/23 0456 04/19/23 0409 04/20/23 0359 04/21/23 0350 04/22/23 0338  WBC 14.8* 11.3* 8.0 5.6 6.0  HGB 12.0 11.0* 9.9* 9.9* 10.3*  HCT 37.8 35.3* 32.3* 32.0* 32.2*  MCV 97.9 99.2 99.4 97.9 93.9  PLT 236 218 225 232 252   Cardiac Enzymes: No results for input(s): "CKTOTAL", "CKMB", "CKMBINDEX", "TROPONINI" in the last 168 hours. BNP: Invalid input(s): "POCBNP" CBG: No results for input(s): "GLUCAP" in the last 168 hours. D-Dimer No results for input(s): "DDIMER" in the last 72 hours. Hgb A1c No results for input(s): "HGBA1C" in the last 72 hours. Lipid Profile No results for input(s): "CHOL", "HDL", "LDLCALC", "TRIG", "CHOLHDL", "LDLDIRECT" in the last 72 hours. Thyroid function studies No results for input(s): "TSH", "T4TOTAL", "T3FREE", "THYROIDAB" in the last 72 hours.  Invalid input(s): "FREET3" Anemia work up No results for input(s): "VITAMINB12", "FOLATE", "FERRITIN", "TIBC", "IRON", "RETICCTPCT" in the last 72 hours. Microbiology Recent Results (from the past 240 hours)  SARS Coronavirus 2 by RT PCR (hospital order, performed in [facility]  hospital lab) *cepheid single result test* Anterior Nasal Swab     Status: None   Collection Time: 04/20/23  9:40 AM   Specimen: Anterior Nasal Swab  Result Value Ref Range Status   SARS Coronavirus 2 by RT PCR NEGATIVE NEGATIVE Final    Comment: (  NOTE) SARS-CoV-2 target nucleic acids are NOT DETECTED.  The SARS-CoV-2 RNA is generally detectable in upper and lower respiratory specimens during the acute phase of infection. The lowest concentration of SARS-CoV-2 viral copies this assay can detect is 250 copies / mL. A negative result does not preclude SARS-CoV-2 infection and should not be used as the sole basis for treatment or other patient management decisions.  A negative result may occur with improper specimen collection / handling, submission of specimen other than nasopharyngeal swab, presence of viral mutation(s) within the areas targeted by this assay, and inadequate number of viral copies (<250 copies / mL). A negative result must be combined with clinical observations, patient history, and epidemiological information.  Fact Sheet for Patients:   RoadLapTop.co.za  Fact Sheet for Healthcare Providers: http://kim-miller.com/  This test is not yet approved or  cleared by the [place] FDA and has been authorized for detection and/or diagnosis of SARS-CoV-2 by FDA under an Emergency Use Authorization (EUA).  This EUA will remain in effect (meaning this test can be used) for the duration of the COVID-19 declaration under Section 564(b)(1) of the Act, 21 U.S.C. section 360bbb-3(b)(1), unless the authorization is terminated or revoked sooner.  Performed at [facility], [address] [place], [place] [address]      Discharge Instructions:   Discharge Instructions     Diet general   Complete by: As directed    Discharge instructions   Complete by: As directed    Do not drive or operate heavy machinery  while taking hycodan/other pain meds   Increase activity slowly   Complete by: As directed    Split night study   Complete by: As directed    Where should this test be performed: [facility]      Allergies as of 04/24/2023   No Known Allergies      Medication List     PAUSE taking these medications    ibandronate 150 MG tablet Wait to take this until your doctor or other care provider tells you to start again. Commonly known as: BONIVA Take 150 mg by mouth every 30 (thirty) days.       STOP taking these medications    amoxicillin-clavulanate 875-125 MG tablet Commonly known as: AUGMENTIN   predniSONE 20 MG tablet Commonly known as: DELTASONE       TAKE these medications    acetaminophen-codeine 300-30 MG tablet Commonly known as: TYLENOL #3 Take 1 tablet by mouth every 4 (four) hours as needed for moderate pain (pain score 4-6) or severe pain (pain score 7-10).   Apixaban Starter Pack (10mg  and 5mg ) Commonly known as: ELIQUIS STARTER PACK Take as directed on package: start with two-5mg  tablets twice daily for 7 days. On day 8, switch to one-5mg  tablet twice daily.   apixaban 5 MG Tabs tablet Commonly known as: ELIQUIS Take 1 tablet (5 mg total) by mouth 2 (two) times daily. Start after you finish starter pack Start taking on: May 22, 2023   b complex vitamins tablet Take 1 tablet by mouth daily.   CALCIUM PO Take 250 mg by mouth 2 (two) times daily.   Coromega Omega 3 Squeeze Emul Take 1 capsule by mouth daily.   fluticasone 50 MCG/ACT nasal spray Commonly known as: FLONASE Place 1 spray into both nostrils daily. What changed:  when to take this reasons to take this   HYDROcodone bit-homatropine 5-1.5 MG/5ML syrup Commonly known as: HYCODAN Take 5 mLs  by mouth every 6 (six) hours as needed for cough.   loratadine 10 MG tablet Commonly known as: Claritin Take 1 tablet (10 mg total) by mouth daily.   MAGNESIUM DR PO Take 1  capsule by mouth daily at 12 noon.   mirtazapine 15 MG tablet Commonly known as: REMERON Take 15 mg by mouth at bedtime.   multivitamin capsule Take 1 capsule by mouth daily.   oxyCODONE 5 MG immediate release tablet Commonly known as: Oxy IR/ROXICODONE Take 1 tablet (5 mg total) by mouth every 6 (six) hours as needed for severe pain.   pantoprazole 40 MG tablet Commonly known as: Protonix Take 1 tablet (40 mg total) by mouth daily.   SUMAtriptan 100 MG tablet Commonly known as: IMITREX Take 100 mg by mouth every 2 (two) hours as needed for headache or migraine.        Follow-up Information     Tomma Lightning, MD Follow up.   Specialty: Pulmonary Disease Contact information: [address] [place] [address] [phone]         Charna Elizabeth, MD. Schedule an appointment as soon as possible for a visit.   Specialty: Gastroenterology Why: for GERD/esophagus follow up Contact information: [address] [place] [address] [phone]                  Time coordinating discharge: 45 min  Signed:  Joseph Art DO  [facility] 04/24/2023, 8:47 AM

## 2023-04-24 NOTE — Care Management Important Message (Signed)
 Important Message  Patient Details IM Letter given to the Patient. Name: Shannon Henderson MRN: [id number] Date of Birth: Dec 02, 1955   Important Message Given:  Yes - Medicare IM     Caren Macadam 04/24/2023, 10:13 AM

## 2023-04-24 NOTE — Progress Notes (Signed)
 AVS given to patient and explained at the bedside. Medications and follow up appointments have been explained with pt verbalizing understanding. Eliquis starter pack given prior to D/C.

## 2023-04-24 NOTE — Telephone Encounter (Signed)
 LM for Mammie to call for HFU w/Dr. Wynona Neat. Sent [facility]

## 2023-04-24 NOTE — Progress Notes (Signed)
 Mobility Specialist - Progress Note   04/24/23 0839  Mobility  Activity Ambulated with assistance in hallway  Level of Assistance Modified independent, requires aide device or extra time  Assistive Device Crutches  Distance Ambulated (ft) 700 ft  Activity Response Tolerated well  Mobility Referral Yes  Mobility visit 1 Mobility  Mobility Specialist Start Time (ACUTE ONLY) 0827  Mobility Specialist Stop Time (ACUTE ONLY) [id number]  Mobility Specialist Time Calculation (min) (ACUTE ONLY) 11 min   Pt received in recliner and agreeable to mobility. No complaints during session. Pt to recliner after session with all needs met.   [facility]

## 2023-04-25 LAB — FACTOR 5 LEIDEN

## 2023-04-26 LAB — HYPERSENSITIVITY PNEUMONITIS
A. Pullulans Abs: NEGATIVE
A.Fumigatus #1 Abs: NEGATIVE
Micropolyspora faeni, IgG: NEGATIVE
Pigeon Serum Abs: NEGATIVE
Thermoact. Saccharii: NEGATIVE
Thermoactinomyces vulgaris, IgG: NEGATIVE

## 2023-04-26 LAB — PROTHROMBIN GENE MUTATION

## 2023-05-04 ENCOUNTER — Other Ambulatory Visit (HOSPITAL_COMMUNITY): Payer: Self-pay | Admitting: *Deleted

## 2023-05-04 DIAGNOSIS — R1310 Dysphagia, unspecified: Secondary | ICD-10-CM

## 2023-05-10 ENCOUNTER — Telehealth: Payer: Self-pay | Admitting: Pulmonary Disease

## 2023-05-10 NOTE — Telephone Encounter (Signed)
 PT said last visit Dr. Was suppose to give her a referral to a hematologist. She has not heard from that office. Please call PT to advise.

## 2023-05-11 ENCOUNTER — Other Ambulatory Visit: Payer: Self-pay

## 2023-05-11 ENCOUNTER — Telehealth: Payer: Self-pay | Admitting: Pulmonary Disease

## 2023-05-11 DIAGNOSIS — Z86718 Personal history of other venous thrombosis and embolism: Secondary | ICD-10-CM

## 2023-05-11 NOTE — Telephone Encounter (Signed)
 Patent is returning call she just received . Called cal no response please give patient a call back

## 2023-05-11 NOTE — Telephone Encounter (Signed)
 PT is asking Dr. Gaynell Keeler the following:  I would prefer to see a hematologist at [facility] rather than in [facility], could you please give me a referral to a hematologist at [facility]?   Dr. Deanna Expose saw her in the hospital.

## 2023-05-11 NOTE — Telephone Encounter (Signed)
 ATC x2 LVM for patient to call our office back regarding prior message.

## 2023-05-11 NOTE — Telephone Encounter (Signed)
 3 attempts. NFN

## 2023-05-11 NOTE — Telephone Encounter (Signed)
 Spoke with Dr.Olalere he has seen patient in the Hospital. [facility] to put a order in for patient to see Hematology in the [facility] system.  Order has been placed and patient has been notified. Patient's voice was understanding.Nothing else further needed.

## 2023-05-11 NOTE — Telephone Encounter (Signed)
 ATC x1 LVM for patient to call our office back. Will send patient a mychart message.

## 2023-05-15 NOTE — Telephone Encounter (Signed)
 Order has been placed . Nothing else further needed.

## 2023-05-17 ENCOUNTER — Ambulatory Visit (HOSPITAL_COMMUNITY)
Admission: RE | Admit: 2023-05-17 | Discharge: 2023-05-17 | Disposition: A | Discharge status: Home / Self Care | Source: Ambulatory Visit | Attending: *Deleted | Admitting: *Deleted

## 2023-05-17 ENCOUNTER — Ambulatory Visit (HOSPITAL_COMMUNITY)
Admission: RE | Admit: 2023-05-17 | Discharge: 2023-05-17 | Disposition: A | Discharge status: Home / Self Care | Source: Ambulatory Visit | Attending: Interventional Radiology | Admitting: Interventional Radiology

## 2023-05-17 DIAGNOSIS — R1312 Dysphagia, oropharyngeal phase: Secondary | ICD-10-CM | POA: Diagnosis not present

## 2023-05-17 DIAGNOSIS — K219 Gastro-esophageal reflux disease without esophagitis: Secondary | ICD-10-CM | POA: Diagnosis not present

## 2023-05-17 DIAGNOSIS — K449 Diaphragmatic hernia without obstruction or gangrene: Secondary | ICD-10-CM | POA: Diagnosis not present

## 2023-05-17 DIAGNOSIS — Z86711 Personal history of pulmonary embolism: Secondary | ICD-10-CM | POA: Insufficient documentation

## 2023-05-17 DIAGNOSIS — R1310 Dysphagia, unspecified: Secondary | ICD-10-CM | POA: Diagnosis present

## 2023-05-17 DIAGNOSIS — R053 Chronic cough: Secondary | ICD-10-CM | POA: Insufficient documentation

## 2023-05-17 NOTE — Therapy (Signed)
 Modified Barium Swallow Study  Patient Details  Name: Shannon Henderson MRN: [id number] Date of Birth: [birth year]  Today's Date: 05/17/2023  Modified Barium Swallow completed.  Full report located under Chart Review in the Imaging Section.  History of Present Illness Shannon Henderson is a 68 yo F referred for MBSS to evaluate swallow physiology. Pt admitted to hospital 04/17/2023 with symptomatic bilateral PE. Pt reports chronic cough ongoing for 3-4 months. She denies difficulty with mastication, denies globus sensation, endorses coughing with foods and liquids. She reports occasional sensation of aspiration with saliva. She is not avoiding any particular food or liquids but does tell SLP she is avoidant overall d/t fears related to swallowing. she has lost about 13 lbs. She has esophogram  04/23/2023 demonstrating hiatal hernia, moderate dysmotility with tertiary contractions, delayed esophageal clearance. Pt reports no changes in cough or dysphagia symptoms. Pt does have reflux, is prescribed PPI which she takes 1x day in the morning.   Clinical Impression Pt presents with mild oropharyngeal dysphagia (DIGEST score 1) and is suggested for regular solid and thin liquid diet. Oral phase of swallow c/b timely and complete mastication, adequate lingual control and motion for directed bolus hold and a/p transit of liquid and solid boluses. Mild oral reside remains on tongue. Pharyngeal swallow is initiated at level of pyriform sinuses with liquids and valleculae for solids. Pt with reduced laryngeal elevation, tongue base retraction, and UES opening. Mild pharyngeal residue in valleculae, pyriform sinuses, tongue base. Pt occasionally manages residue with spontaneous re-swallow which is effective in clearing majority of material.  Other times, residue remains with SLP cueing for re-swallow. X1 instance of penetration to vocal folds, prior to swallow initiation. Majority of material ejects from airway during the swallow.  With pt complaints of coughing on saliva, known hx of reflux, and demonstration of dysmotility via barium swallow, pt could potentially have post prandial aspiration. Recommend pt follow with OP SLP as planned to address physiologic deficits noted during today's session, complete training for aspiration precautions, and for education on reflux management precautions. Factors that may increase risk of adverse event in presence of aspiration Roderick Civatte & Jessy Morocco 2021): Respiratory or GI disease;Weak cough  Swallow Evaluation Recommendations Recommendations: PO diet PO Diet Recommendation: Regular;Thin liquids (Level 0) Liquid Administration via: Cup;Straw Medication Administration: Whole meds with liquid Supervision: Patient able to self-feed Swallowing strategies  : Minimize environmental distractions;Slow rate;Small bites/sips Postural changes: Position pt fully upright for meals;Stay upright 30-60 min after meals Oral care recommendations: Oral care BID (2x/day)      Alston Jerry 05/17/2023,12:54 PM

## 2023-05-23 ENCOUNTER — Ambulatory Visit: Payer: Self-pay

## 2023-05-23 NOTE — Telephone Encounter (Signed)
 PCP requesting sooner follow up visit.   Copied from CRM [id number]. Topic: Appointments - Appointment Cancel/Reschedule >> May 23, 2023  4:14 PM Alverda Joe S wrote: Patient/patient representative is calling to cancel or reschedule an appointment. Refer to attachments for appointment information.  Patient docotr office, [facility] in [place], is calling because patients primary docotr is concerned that patient needs to see pulmnologist sooner because when patient came in to office she has a pulmonary embolism, shortness of breath, tachycardia(pulse rate of 118), and on elliquis. Please can someone find patient sooner appointment before condition worsens.

## 2023-05-24 NOTE — Telephone Encounter (Signed)
 We have no sooner appts w/any provider  (Including [facility] & [place]) earlier than his existing appt on 06/07/23 unless Triage want's to see if another Dr. Edrie Gower for this PT.  I will put him on a wait list.

## 2023-05-24 NOTE — Telephone Encounter (Signed)
 I will route to the front desk for scheduling.

## 2023-05-28 NOTE — Telephone Encounter (Signed)
 Dr Dione Franks, are you able to see him sooner? Okay to double book on a day you already aren't?

## 2023-05-30 NOTE — Telephone Encounter (Signed)
 I have an 11:30 today. If there are concerns about abnormal vital signs they need emergency room evaluation.

## 2023-05-31 NOTE — Telephone Encounter (Addendum)
 ATC patient x1.  LVM to return call.  Patient returned call.  I provided the information the Dr. Dione Franks provided yesterday.  She verbalized understanding.  She stated that she had some lab work done and her thyroid is out of wack and some of the symptoms could be due to this issue.  She will keep her appointment with Dr. Dione Franks on 5/22.  Nothing further needed.

## 2023-06-05 ENCOUNTER — Ambulatory Visit: Attending: Family Medicine

## 2023-06-05 DIAGNOSIS — R1312 Dysphagia, oropharyngeal phase: Secondary | ICD-10-CM | POA: Diagnosis present

## 2023-06-05 NOTE — Patient Instructions (Signed)
SWALLOWING EXERCISES  Effortful Swallows - Squeeze hard with the muscles in your neck while you swallow your  saliva or a sip of water - Repeat 20 times, 2-3 times a day, and use whenever you eat or drink  Masako Swallow - swallow with your tongue sticking out - Stick tongue out and gently bite tongue with your teeth - Swallow, while holding your tongue with your teeth - Repeat 20 times, 2-3 times a day  Shaker Exercise - head lift - Lie flat on your back in your bed or on a couch without pillows - Raise your head and look at your feet  - KEEP YOUR SHOULDERS DOWN - HOLD FOR 45 to 60 SECONDS, then lower your head back down - Repeat 3 times, 2-3 times a day  [employer] -  swallow as tight as you  for 5 seconds - Start to swallow, and keep your Adam's apple up by squeezing tight with the muscles of the throat - Hold the squeeze for 5-7 seconds and then relax - Repeat 20 times, 2-3 times a day        5. CTAR - Chin Tuck Against Resistance              - Place towel, ball or pool noodle under your chin             - Hold for 60 seconds 2-3x  a day             - Pulse up and down 20x 2-3x a day

## 2023-06-05 NOTE — Therapy (Signed)
 OUTPATIENT SPEECH LANGUAGE PATHOLOGY SWALLOW EVALUATION   Patient Name: Shannon Henderson MRN: [id number] DOB:1956/01/03, 68 y.o., female Today's Date: 06/05/2023  PCP: Not in system REFERRING PROVIDER: Margaret Sharp, PA-C  END OF SESSION:  End of Session - 06/05/23 1506     Visit Number 1    Number of Visits 5    Date for SLP Re-Evaluation 08/14/23   extended for scheduling   Authorization Type BC[employer]    SLP Start Time 1318    SLP Stop Time  1400    SLP Time Calculation (min) 42 min    Activity Tolerance Patient tolerated treatment well             Past Medical History:  Diagnosis Date   Amputee, below knee, right (HCC)    Fracture of multiple ribs 04/19/2017   L ribs 5-10 closed fractures   Pneumothorax on left 04/19/2017   s/p fall from horse   History reviewed. No pertinent surgical history. Patient Active Problem List   Diagnosis Date Noted   Acute pulmonary embolism (HCC) 04/17/2023   Rib fractures 04/19/2017    ONSET DATE: 05/03/2023 (referral date)   REFERRING DIAG: R93.3 (ICD-10-CM) - Abnormal findings on diagnostic imaging of other parts of digestive tract  THERAPY DIAG: Dysphagia, oropharyngeal phase  Rationale for Evaluation and Treatment: Rehabilitation  SUBJECTIVE:   SUBJECTIVE STATEMENT: "I haven't aspirated this week" Pt accompanied by: self  PERTINENT HISTORY: "Nacole is a 68 yo F referred for MBSS to evaluate swallow physiology. Pt admitted to hospital 04/17/2023 with symptomatic bilateral PE. Pt reports chronic cough ongoing for 3-4 months. She denies difficulty with mastication, denies globus sensation, endorses coughing with foods and liquids. She reports occasional sensation of aspiration with saliva. She is not avoiding any particular food or liquids but does tell SLP she is avoidant overall d/t fears related to swallowing. she has lost about 13 lbs. She has esophogram 04/23/2023 demonstrating hiatal hernia, moderate dysmotility with tertiary  contractions, delayed esophageal clearance. Pt reports no changes in cough or dysphagia symptoms. Pt does have reflux, is prescribed PPI which she takes 1x day in the morning."   PAIN: Are you having pain? No  FALLS: Has patient fallen in last 6 months?  Yes  LIVING ENVIRONMENT: Lives with: lives with their family Lives in: House/apartment  PLOF:  Level of assistance: Independent with ADLs, Independent with IADLs Employment: Full-time employment  PATIENT GOALS: improve safety during swallowing  OBJECTIVE:  Note: Objective measures were completed at Evaluation unless otherwise noted. OBJECTIVE:  INSTRUMENTAL SWALLOW STUDY FINDINGS (MBSS) 05/17/23 Pt presents with mild oropharyngeal dysphagia (DIGEST score 1) and is suggested for regular solid and thin liquid diet. Oral phase of swallow c/b timely and complete mastication, adequate lingual control and motion for directed bolus hold and a/p transit of liquid and solid boluses. Mild oral reside remains on tongue. Pharyngeal swallow is initiated at level of pyriform sinuses with liquids and valleculae for solids. Pt with reduced laryngeal elevation, tongue base retraction, and UES opening. Mild pharyngeal residue in valleculae, pyriform sinuses, tongue base. Pt occasionally manages residue with spontaneous re-swallow which is effective in clearing majority of material. Other times, residue remains with SLP cueing for re-swallow. X1 instance of penetration to vocal folds, prior to swallow initiation. Majority of material ejects from airway during the swallow. With pt complaints of coughing on saliva, known hx of reflux, and demonstration of dysmotility via barium swallow, pt could potentially have post prandial aspiration. Recommend pt follow with OP  SLP as planned to address physiologic deficits noted during today's session, complete training for aspiration precautions, and for education on reflux management precautions.    COGNITION: Overall  cognitive status: Within functional limits for tasks assessed  SUBJECTIVE DYSPHAGIA REPORTS:  Date of onset: since hospitalization Reported symptoms: coughing with both solids and liquids and globus sensation  Current diet: regular and thin liquids  Co-morbid voice changes: No  FACTORS WHICH MAY INCREASE RISK OF ADVERSE EVENT IN PRESENCE OF ASPIRATION:  General health: well appearing  Risk factors: none evident     ORAL MOTOR EXAMINATION: Overall status: WFL  CLINICAL SWALLOW ASSESSMENT:   Dentition: adequate natural dentition Vocal quality at baseline: normal Patient directly observed with POs: Yes: thin liquids  Feeding: able to feed self Liquids provided by: cup Oral phase signs and symptoms: none Pharyngeal phase signs and symptoms: none with thin liquids today  PATIENT REPORTED OUTCOME MEASURES (PROM): EAT-10: 12  Question Patient's Response  My swallowing problem has caused me to lose weight 3  2.  My swallowing problem interferes with my ability to go out to meals 0  3.  Swallowing liquids takes extra effort 1  4.  Swallowing solids takes extra effort 1  5.  Swallowing pills takes extra effort 0  6.  Swallowing is painful 0  7.  The pleasure of eating is affected by my swallowing 2  8.  When I swallow food sticks in my throat 2  9.  I cough when I eat 1  10.  Swallowing is stressful  2  0= No problem 4= Severe problem                                                                                        TREATMENT DATE:  06/05/23: ST evaluation and POC complete. Initiated education and instruction of swallow exercises to address aforementioned deficits, including effortful swallow, Masako swallow, Mendelsohn, and CTAR. Pt able to complete exercises with rare min A. Updated HEP, with handout provided.    PATIENT EDUCATION: Education details: see above Person educated: Patient Education method: Explanation, Demonstration, and Handouts Education comprehension:  verbalized understanding, returned demonstration, and needs further education   ASSESSMENT:  CLINICAL IMPRESSION: Patient is a 68 y.o. F who was seen today for ST evaluation d/t mild oropharyngeal dysphagia evidenced on MBSS in early May 2025. Initiated education and instruction of swallow exercises to address deficits identified on MBSS. Handout provided with recommendations. Pt would benefit from short course of skilled ST intervention to optimize swallow safety and decrease risk of aspiration.    OBJECTIVE IMPAIRMENTS: include dysphagia. These impairments are limiting patient from safety when swallowing. Factors affecting potential to achieve goals and functional outcome are none. Patient will benefit from skilled SLP services to address above impairments and improve overall function.  REHAB POTENTIAL: Good   GOALS: Goals reviewed with patient? Yes  LONG TERM GOALS: Target date: 08/14/2023 (STG=LTGs; extended for scheduling delays)  Pt will complete daily HEP > 1 week  Baseline:  Goal status: INITIAL  2.  Pt will demonstrate recommended swallow exercises given mod I  Baseline:  Goal status: INITIAL  3.  Pt will report improved swallow function via PROM by 2 points at LTG date Baseline: EAT-10=12 Goal status: INITIAL  PLAN:  SLP FREQUENCY: 1x/week  SLP DURATION: 10 weeks (extended for scheduling)  PLANNED INTERVENTIONS: Aspiration precaution training, Pharyngeal strengthening exercises, Diet toleration management , Cueing hierachy, SLP instruction and feedback, Compensatory strategies, and [address] Treatment of swallowing function    Tamar Fairly, CCC-SLP 06/05/2023, 3:15 PM

## 2023-06-07 ENCOUNTER — Encounter: Payer: Self-pay | Admitting: Internal Medicine

## 2023-06-07 ENCOUNTER — Ambulatory Visit: Admitting: Internal Medicine

## 2023-06-07 VITALS — BP 145/82 | HR 60 | Ht 73.0 in | Wt 163.2 lb

## 2023-06-07 DIAGNOSIS — R0683 Snoring: Secondary | ICD-10-CM

## 2023-06-07 DIAGNOSIS — E0590 Thyrotoxicosis, unspecified without thyrotoxic crisis or storm: Secondary | ICD-10-CM

## 2023-06-07 DIAGNOSIS — Z7722 Contact with and (suspected) exposure to environmental tobacco smoke (acute) (chronic): Secondary | ICD-10-CM

## 2023-06-07 DIAGNOSIS — K219 Gastro-esophageal reflux disease without esophagitis: Secondary | ICD-10-CM | POA: Diagnosis not present

## 2023-06-07 DIAGNOSIS — R2240 Localized swelling, mass and lump, unspecified lower limb: Secondary | ICD-10-CM | POA: Diagnosis not present

## 2023-06-07 DIAGNOSIS — I2699 Other pulmonary embolism without acute cor pulmonale: Secondary | ICD-10-CM | POA: Diagnosis not present

## 2023-06-07 DIAGNOSIS — R600 Localized edema: Secondary | ICD-10-CM

## 2023-06-07 DIAGNOSIS — Z7901 Long term (current) use of anticoagulants: Secondary | ICD-10-CM

## 2023-06-07 MED ORDER — PANTOPRAZOLE SODIUM 40 MG PO TBEC: 40.0000 mg | DELAYED_RELEASE_TABLET | Freq: Every day | ORAL | 3 refills | Status: DC

## 2023-06-07 MED ORDER — APIXABAN 5 MG PO TABS: 5.0000 mg | ORAL_TABLET | Freq: Two times a day (BID) | ORAL | 3 refills | Status: AC

## 2023-06-07 NOTE — Patient Instructions (Signed)
 It was a pleasure to see you today!  Please schedule follow up with myself in 3 months.  If my schedule is not open yet, we will contact you with a reminder closer to that time. Please call [phone] if you haven't heard from us  a month before, and always call us  sooner if issues or concerns arise. You can also send us  a message through MyChart, but but aware that this is not to be used for urgent issues and it may take up to 5-7 days to receive a reply. Please be aware that you will likely be able to view your results before I have a chance to respond to them. Please give us  5 business days to respond to any non-urgent results.   VISIT SUMMARY:  Today, we discussed your recent health concerns, including your history of pulmonary embolism, persistent weakness, ankle swelling, hyperthyroidism, weight loss, aspiration problems, and suspected sleep apnea. We reviewed your current medications and made plans for further testing and follow-up with specialists.  YOUR PLAN:  -PULMONARY EMBOLISM: A pulmonary embolism is a blood clot in the lungs, often caused by immobility during travel. You will continue taking Eliquis  for 3 months to prevent further clots. We will also order blood work to check for clotting disorders after you stop Eliquis  to see if there is a blood clotting disorder. Follow up with hematology for this. In some instances if we are considering this to be an unprovoked event we recommend lifelong blood thinners at a reduced dose.   -LEG SWELLING: Your leg swelling may be related to your recent pulmonary embolism. We will order a repeat ultrasound to check for any clots in your leg. Elevating your legs can help reduce the swelling.  -HYPERTHYROIDISM: Hyperthyroidism is a condition where your thyroid is overactive, causing symptoms like tremors, a high heart rate, and weight loss. You will continue to follow up with your endocrinologist and monitor your thyroid function tests.  -ASPIRATION  PROBLEMS: Aspiration problems occur when food or liquid enters the airway. You have been working with a Doctor, general practice and have seen improvement in your cough with Protonix . Continue with the exercises provided by the speech pathologist.  -SLEEP APNEA: Sleep apnea is a condition where breathing repeatedly stops and starts during sleep. You have been using a snore guard, but we will order a home sleep test while wearing the snore guard. If the home test is negative and symptoms persist, we may consider an in-lab sleep test.

## 2023-06-07 NOTE — Progress Notes (Signed)
 Shannon Henderson    [id number]    [birth year]  Primary Care Physician:System, Provider Not In Date of Appointment: 06/07/2023 Established Patient Visit  Chief complaint:   Chief Complaint  Patient presents with   Follow-up    Hospital follow up     HPI: Shannon Henderson is a 68 y.o. woman who was hospitalized in April 2025 with provoked acute pulmonary embolism and started on eliqus.  Interval Updates: Here for hospital follow up.   Discussed the use of AI scribe software for clinical note transcription with the patient, who gave verbal consent to proceed.  History of Present Illness Shannon Henderson is a 68 year old female with a history of pulmonary embolism who presents with persistent weakness and ankle swelling.  She was hospitalized last month for a pulmonary embolism and was discharged on Eliquis . Since discharge, she has experienced persistent physical weakness, particularly in her legs, and intermittent swelling in her ankle, a new symptom since April 1st. She also experiences a high heart rate, which has improved with beta blocker treatment. No shortness of breath currently.  She has a cough, especially upon waking, with mucus from her lungs and sinuses. She has aspiration problems and has seen a speech pathologist who provided exercises to help manage this issue. She experiences difficulty sleeping propped up due to reflux, although she does not have typical reflux symptoms like heartburn, but rather coughing. The coughing is improved with sleeping upright but she has a hard time getting comfortable.   She has experienced significant weight loss, affecting the fit of her prosthetic socket, leading to sores. She is hesitant to get a new socket until her weight stabilizes due to the cost. She remains very active, averaging 13,000 steps a day, and judges equestrian events. She travels frequently, including long drives and is unsure if the pulmonary embolism was truly  provoked because she gets out of the car every 1-2 hours to walk around, and she has been traveling this way for years.   Her thyroid labs came back abnormal, with a low TSH, and she has symptoms of shaking in her hands and legs. She has no prior history of thyroid disorder and is scheduled to see an endocrinologist soon.  She is supposed to undergo a sleep study. She has been using a snore guard for about four months, which helps with snoring but was stopped due to severe coughing upon lying down.   I have reviewed the patient's family social and past medical history and updated as appropriate.   Past Medical History:  Diagnosis Date   Amputee, below knee, right (HCC)    Fracture of multiple ribs 04/19/2017   L ribs 5-10 closed fractures   Pneumothorax on left 04/19/2017   s/p fall from horse    History reviewed. No pertinent surgical history.  Family History  Problem Relation Age of Onset   Breast cancer Mother 92    Social History   Occupational History   Not on file  Tobacco Use   Smoking status: Never    Passive exposure: Past   Smokeless tobacco: Never  Substance and Sexual Activity   Alcohol use: Never   Drug use: Never   Sexual activity: Not on file     Physical Exam: Blood pressure (!) 145/82, pulse 60, height 6\' 1"  (1.854 m), weight 163 lb 3.2 oz (74 kg), SpO2 99%.  Gen:      No acute distress ENT:  no nasal  polyps, mucus membranes moist Lungs:    No increased respiratory effort, symmetric chest wall excursion, clear to auscultation bilaterally, no wheezes or crackles CV:         Regular rate and rhythm; no murmurs, rubs, or gallops.  No pedal edema Ext: RLE knee amputation with prosthesis, LLL non pitting edema, warm, no tenderness or erythema  Data Reviewed: Imaging: I have personally reviewed the CTPE study which shows acute pulmonary embolism   PFTs:  Labs:  Tsh low, Free T4 elevated beta 2 glycoprotein, prothrombin gene mutation, Factor V  leiden, all negative. lupus Ac obtained while on anticoagulation.   Lab Results  Component Value Date   NA 138 04/22/2023   K 3.9 04/22/2023   CO2 24 04/22/2023   GLUCOSE 101 (H) 04/22/2023   BUN 19 04/22/2023   CREATININE 0.60 04/22/2023   CALCIUM 9.0 04/22/2023   GFRNONAA >60 04/22/2023   Lab Results  Component Value Date   WBC 6.0 04/22/2023   HGB 10.3 (L) 04/22/2023   HCT 32.2 (L) 04/22/2023   MCV 93.9 04/22/2023   PLT 252 04/22/2023    Immunization status: Immunization History  Administered Date(s) Administered   Fluzone Influenza virus vaccine,trivalent (IIV3), split virus 09/30/2020   Hep A / Hep B 06/07/2011, 06/13/2011, 06/19/2011, 07/18/2017   Influenza, Seasonal, Injecte, Preservative Fre 09/17/2019   Influenza,inj,Quad PF,6+ Mos 09/18/2018   Influenza-Unspecified 09/18/2018, 10/24/2021   PFIZER Comirnaty(Gray Top)Covid-19 Tri-Sucrose Vaccine 03/23/2020   PFIZER(Purple Top)SARS-COV-2 Vaccination 04/07/2019, 05/05/2019, 09/11/2019   PNEUMOCOCCAL CONJUGATE-20 08/08/2022   Pfizer Covid-19 Vaccine Bivalent Booster 62yrs & up 09/30/2020   Pfizer(Comirnaty)Fall Seasonal Vaccine 12 years and older 10/24/2021, 11/01/2022   Td (Adult),5 Lf Tetanus Toxid, Preservative Free 07/18/2017   Tdap 06/12/2006   Zoster Recombinant(Shingrix) 07/29/2019, 10/28/2019    External Records Personally Reviewed: hospital stay  Assessment and Plan Assessment & Plan Acute Pulmonary embolism Pulmonary embolism likely provoked by travel-related immobility however patient concerned that she travels frequently like she did prior to PE and is very active >13k steps/day. Currently on Eliquis . No genetic clotting disorder identified with genetic testing blood work thus far, although protein C/S pending. Lupus AC needs repeat off AC. - Continue Eliquis  for 3 months. Refilled today.  - Order blood work to assess for clotting disorder after discontinuation of Eliquis  at 3 months. - follow up with  hematology for remainder of hypercoagulable work up with pause in eliquis  at 3 months. Discussed potential for continued Eliquis  at lower dose for prevention at 2.5 mg BID.   Leg swelling New leg swelling since pulmonary embolism. Previous ultrasound negative for clot. Swelling improves with elevation. - Order repeat ultrasound to check for clot in leg. - no evidence of ischemia  Hyperthyroidism Newly diagnosed hyperthyroidism with tremors, tachycardia, and weight loss. TSH levels very low, Free T3 elevated. Symptoms likely related to thyroid condition. Endocrinologist to manage. - Continue follow-up with endocrinologist. - Monitor thyroid function tests.  GERD and Aspiration problems Aspiration issues with coughing, especially when supine. Speech pathology involved. Improvement in cough with Protonix  and sleeping upright. Continue SLP, refilled protonix .   Sleep apnea Suspected sleep apnea. Using snore guard. Difficulty sleeping propped up due to reflux. Discussed home sleep test with snore guard. - Order home sleep test with snore guard to see if residual symptoms.  - Consider in-lab sleep test if home test is negative and symptoms persist.   I spent 40 minutes on 06/07/2023 in care of this patient including face to face time and  non-face to face time spent charting, review of outside records, and coordination of care.   Return to Care: Return in about 3 months (around 09/07/2023).   Louie Rover, MD Pulmonary and Critical Care Medicine [facility] Office:[phone]

## 2023-06-12 ENCOUNTER — Inpatient Hospital Stay

## 2023-06-12 ENCOUNTER — Other Ambulatory Visit: Payer: Self-pay

## 2023-06-12 ENCOUNTER — Inpatient Hospital Stay: Attending: Hematology | Admitting: Hematology

## 2023-06-12 VITALS — BP 146/81 | HR 72 | Temp 97.3°F | Resp 20 | Wt 162.4 lb

## 2023-06-12 DIAGNOSIS — E0590 Thyrotoxicosis, unspecified without thyrotoxic crisis or storm: Secondary | ICD-10-CM | POA: Diagnosis not present

## 2023-06-12 DIAGNOSIS — I2699 Other pulmonary embolism without acute cor pulmonale: Secondary | ICD-10-CM

## 2023-06-12 DIAGNOSIS — Z86711 Personal history of pulmonary embolism: Secondary | ICD-10-CM | POA: Insufficient documentation

## 2023-06-12 DIAGNOSIS — Z7901 Long term (current) use of anticoagulants: Secondary | ICD-10-CM | POA: Diagnosis not present

## 2023-06-12 DIAGNOSIS — D649 Anemia, unspecified: Secondary | ICD-10-CM | POA: Diagnosis not present

## 2023-06-12 DIAGNOSIS — Z79899 Other long term (current) drug therapy: Secondary | ICD-10-CM | POA: Diagnosis not present

## 2023-06-12 DIAGNOSIS — R053 Chronic cough: Secondary | ICD-10-CM | POA: Insufficient documentation

## 2023-06-12 LAB — CMP (CANCER CENTER ONLY)
ALT: 24 U/L (ref 0–44)
AST: 20 U/L (ref 15–41)
Albumin: 3.9 g/dL (ref 3.5–5.0)
Alkaline Phosphatase: 30 U/L — ABNORMAL LOW (ref 38–126)
Anion gap: 8 (ref 5–15)
BUN: 16 mg/dL (ref 8–23)
CO2: 27 mmol/L (ref 22–32)
Calcium: 9.4 mg/dL (ref 8.9–10.3)
Chloride: 107 mmol/L (ref 98–111)
Creatinine: 0.56 mg/dL (ref 0.44–1.00)
GFR, Estimated: >60 mL/min (ref >60)
Glucose, Bld: 90 mg/dL (ref 70–99)
Potassium: 4 mmol/L (ref 3.5–5.1)
Sodium: 142 mmol/L (ref 135–145)
Total Bilirubin: 0.5 mg/dL (ref 0.0–1.2)
Total Protein: 6.5 g/dL (ref 6.5–8.1)

## 2023-06-12 LAB — CBC WITH DIFFERENTIAL (CANCER CENTER ONLY)
Abs Immature Granulocytes: 0.01 10*3/uL (ref 0.00–0.07)
Basophils Absolute: 0 10*3/uL (ref 0.0–0.1)
Basophils Relative: 1 %
Eosinophils Absolute: 0.2 10*3/uL (ref 0.0–0.5)
Eosinophils Relative: 4 %
HCT: 37.7 % (ref 36.0–46.0)
Hemoglobin: 12.4 g/dL (ref 12.0–15.0)
Immature Granulocytes: 0 %
Lymphocytes Relative: 32 %
Lymphs Abs: 1.5 10*3/uL (ref 0.7–4.0)
MCH: 29.5 pg (ref 26.0–34.0)
MCHC: 32.9 g/dL (ref 30.0–36.0)
MCV: 89.8 fL (ref 80.0–100.0)
Monocytes Absolute: 0.6 10*3/uL (ref 0.1–1.0)
Monocytes Relative: 12 %
Neutro Abs: 2.5 10*3/uL (ref 1.7–7.7)
Neutrophils Relative %: 51 %
Platelet Count: 283 10*3/uL (ref 150–400)
RBC: 4.2 MIL/uL (ref 3.87–5.11)
RDW: 13.4 % (ref 11.5–15.5)
WBC Count: 4.9 10*3/uL (ref 4.0–10.5)
nRBC: 0 % (ref 0.0–0.2)

## 2023-06-12 LAB — T4, FREE: Free T4: 0.89 ng/dL (ref 0.61–1.12)

## 2023-06-12 LAB — ANTITHROMBIN III: AntiThromb III Func: 111 % (ref 75–120)

## 2023-06-12 NOTE — Progress Notes (Signed)
 HEMATOLOGY/ONCOLOGY CONSULTATION NOTE  Date of Service: 06/12/2023  Patient Care Team: System, Provider Not In as PCP - General  CHIEF COMPLAINTS/PURPOSE OF CONSULTATION:   Hx of PE  HISTORY OF PRESENTING ILLNESS:  Shannon Henderson is a wonderful 68 y.o. female who has been referred to us  by Aleck Hurdle, MD for evaluation and management of Hx of PE.  Patient was hospitalized in April for acute bilateral PE. She reports that she has never had blood clots prior to her recent event in April.   She is noted to have had long-distance travel to [place]  2 weeks prior to her blood clot event. Patient notes that she took frequent breaks during her travel to [place]  and denies sitting for 4 hours at a time.   Patient reports having a fall due to slipping, into the bathtub onto her right hip 1 week prior to her blood clot causing a "huge" bruise in the area.   Patient denies any history of HTN. She is on Atenolol for management of elevated heart rate. She notes an event of elevated resting HR of 110 bpm. Patient notes that her heart rate was mildly high prior to her blood clot.   She reports chronic cough, swallowing issue and follows with Dr. Tova Fresh. She also reports working with a Doctor, general practice. Patient has no other chronic medical issues.   She generally stays well-hydrated. And denies any concerns with being dehydrated at any point.   Her free T4 has quickly improved from 4.2  to 2.3 in the last 1-2 weeks. She has never had thyroid issues in the past.   Patient reports losing 15+ pounds in the last couple of months. She notes previously weighing 178 pounds prior to traveling to [place] , and 162 pounds currently. She reports normal eating habits though she continues to lose weight.   She reports previous persistent cough thought to be related to aspiration, which has improved.  She denies any fhx of blood clots. Patient denies any fhx of blood clotting disorder. She denies any hx  of blood cotting disorder.   She reports having several surgeries in the past, none of which have triggered a blood clot. Her most recent surgery was in 2011.   She was previously on birth control pill a long time ago for 3 years, which never triggered a blood clot.  She notes that she frequently travels, including traveling to [place] twice as well as [place].   She has an upcoming sleep study. Patient has been wearing a snore guard as recommended by her dentisit.   Patient has a very physically active lifestyle, including walking 15,000 steps daily and playing pickleball. 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 Patient reports that she is close to her baseline functioning at this time.   Patient has never been pregnant . She is not a smoker. She notes that her father was a heavy smoker.   She denies any considerations that would her at higher risk of bleeding, such as recurrent nose bleeds or stomach ulcers.   She denies any change in bowel habits, new lumps/bumps or abdominal pain.   Patient reports that her previous symptoms, which have all nearly resolved or are improving, include lots of fatigue, hand and leg shaking, leg swelling, elevated HR, weakness, coughing, and aspirating. She notes that her pain began with back pain, though most of her pain has resolved at this time. Her SOB has resolved.   She reports that she is taking  oral iron for anemia.   She reports that Dr. Dione Franks refilled her Eliquis  last week.  She reports that 1 week before her blood clot, she was on prednisone and antibiotics due to treating sinus infection.   She last had a colonoscopy at age 89  She denies any fhx of blood disorder.   Patient reports that her father had hx of end stage COPD from hisotory of smoking. Her mother has hx of breast cancer diagnosed at age 57. There is no other fhx of cancer.   She has an upcoming appointment with her endocrinologist next Wednesday.   MEDICAL HISTORY:   Past Medical History:  Diagnosis Date   Amputee, below knee, right (HCC)    Fracture of multiple ribs 04/19/2017   L ribs 5-10 closed fractures   Pneumothorax on left 04/19/2017   s/p fall from horse    SURGICAL HISTORY: No past surgical history on file.  SOCIAL HISTORY: Social History   Socioeconomic History   Marital status: Married    Spouse name: Not on file   Number of children: Not on file   Years of education: Not on file   Highest education level: Not on file  Occupational History   Not on file  Tobacco Use   Smoking status: Never    Passive exposure: Past   Smokeless tobacco: Never  Substance and Sexual Activity   Alcohol use: Never   Drug use: Never   Sexual activity: Not on file  Other Topics Concern   Not on file  Social History Narrative   Not on file   Social Drivers of Health   Financial Resource Strain: Not on file  Food Insecurity: Low Risk  (06/04/2023)   Received from [facility]   Hunger Vital Sign    Worried About Running Out of Food in the Last Year: Never true    Ran Out of Food in the Last Year: Never true  Transportation Needs: No Transportation Needs (06/04/2023)   Received from [facility]    In the past 12 months, has lack of reliable transportation kept you from medical appointments, meetings, work or from getting things needed for daily living? : No  Physical Activity: Not on file  Stress: Not on file  Social Connections: Socially Integrated (04/17/2023)   Social Connection and Isolation Panel [NHANES]    Frequency of Communication with Friends and Family: More than three times a week    Frequency of Social Gatherings with Friends and Family: More than three times a week    Attends Religious Services: More than 4 times per year    Active Member of [employer] or Organizations: Yes    Attends Banker Meetings: More than 4 times per year    Marital Status: Married  Catering manager Violence: Not At Risk  (04/17/2023)   Humiliation, Afraid, Rape, and Kick questionnaire    Fear of Current or Ex-Partner: No    Emotionally Abused: No    Physically Abused: No    Sexually Abused: No    FAMILY HISTORY: Family History  Problem Relation Age of Onset   Breast cancer Mother 22    ALLERGIES:  has no known allergies.  MEDICATIONS:  Current Outpatient Medications  Medication Sig Dispense Refill   acetaminophen -codeine  (TYLENOL  #3) 300-30 MG tablet Take 1 tablet by mouth every 4 (four) hours as needed for moderate pain (pain score 4-6) or severe pain (pain score 7-10).     apixaban  (ELIQUIS ) 5 MG  TABS tablet Take 1 tablet (5 mg total) by mouth 2 (two) times daily. Start after you finish starter pack 60 tablet 3   APIXABAN  (ELIQUIS ) VTE STARTER PACK (10MG  AND 5MG ) Take as directed on package: start with two-5mg  tablets twice daily for 7 days. On day 8, switch to one-5mg  tablet twice daily. 74 each 0   atenolol (TENORMIN) 25 MG tablet Take 25 mg by mouth daily.     b complex vitamins tablet Take 1 tablet by mouth daily.     benzonatate (TESSALON) 200 MG capsule Take 200 mg by mouth 2 (two) times daily as needed for cough.     CALCIUM PO Take 250 mg by mouth 2 (two) times daily.     fluticasone  (FLONASE ) 50 MCG/ACT nasal spray Place 1 spray into both nostrils daily. 10 each 1   HYDROcodone  bit-homatropine (HYCODAN) 5-1.5 MG/5ML syrup Take 5 mLs by mouth every 6 (six) hours as needed for cough. 120 mL 0   [Paused] ibandronate (BONIVA) 150 MG tablet Take 150 mg by mouth every 30 (thirty) days.     loratadine  (CLARITIN ) 10 MG tablet Take 1 tablet (10 mg total) by mouth daily. 30 tablet 1   Magnesium Chloride (MAGNESIUM DR PO) Take 1 capsule by mouth daily at 12 noon.     mirtazapine  (REMERON ) 15 MG tablet Take 15 mg by mouth at bedtime.  3   Multiple Vitamin (MULTIVITAMIN) capsule Take 1 capsule by mouth daily.     Omega-3 Fatty Acids (COROMEGA OMEGA 3 SQUEEZE) EMUL Take 1 capsule by mouth daily.      oxyCODONE  (OXY IR/ROXICODONE ) 5 MG immediate release tablet Take 1 tablet (5 mg total) by mouth every 6 (six) hours as needed for severe pain. 20 tablet 0   pantoprazole  (PROTONIX ) 40 MG tablet Take 1 tablet (40 mg total) by mouth daily. 90 tablet 3   SUMAtriptan  (IMITREX ) 100 MG tablet Take 100 mg by mouth every 2 (two) hours as needed for headache or migraine.     No current facility-administered medications for this visit.    REVIEW OF SYSTEMS:    10 Point review of Systems was done is negative except as noted above.  PHYSICAL EXAMINATION: ECOG PERFORMANCE STATUS: 2 - Symptomatic, <50% confined to bed  . Vitals:   06/12/23 1456  BP: (!) 146/81  Pulse: 72  Resp: 20  Temp: (!) 97.3 F (36.3 C)  SpO2: 98%   Filed Weights   06/12/23 1456  Weight: 162 lb 6.4 oz (73.7 kg)   .Body mass index is 21.43 kg/m.  GENERAL:alert, in no acute distress and comfortable SKIN: no acute rashes, no significant lesions EYES: conjunctiva are pink and non-injected, sclera anicteric OROPHARYNX: MMM, no exudates, no oropharyngeal erythema or ulceration NECK: supple, no JVD LYMPH:  no palpable lymphadenopathy in the cervical, axillary or inguinal regions LUNGS: clear to auscultation b/l with normal respiratory effort HEART: regular rate & rhythm ABDOMEN:  normoactive bowel sounds , non tender, not distended. Extremity: no pedal edema PSYCH: alert & oriented x 3 with fluent speech NEURO: no focal motor/sensory deficits  LABORATORY DATA:  I have reviewed the data as listed  .    Latest Ref Rng & Units 06/12/2023    4:00 PM 04/22/2023    3:38 AM 04/21/2023    3:50 AM  CBC  WBC 4.0 - 10.5 K/uL 4.9  6.0  5.6   Hemoglobin 12.0 - 15.0 g/dL 25.9  56.3  9.9   Hematocrit 36.0 - 46.0 %  37.7  32.2  32.0   Platelets 150 - 400 K/uL 283  252  232     .    Latest Ref Rng & Units 06/12/2023    4:00 PM 04/22/2023    3:38 AM 04/20/2023    3:59 AM  CMP  Glucose 70 - 99 mg/dL 90  409  94   BUN 8 - 23  mg/dL 16  19  24    Creatinine 0.44 - 1.00 mg/dL 8.11  9.14  7.82   Sodium 135 - 145 mmol/L 142  138  136   Potassium 3.5 - 5.1 mmol/L 4.0  3.9  4.0   Chloride 98 - 111 mmol/L 107  106  105   CO2 22 - 32 mmol/L 27  24  21    Calcium 8.9 - 10.3 mg/dL 9.4  9.0  8.6   Total Protein 6.5 - 8.1 g/dL 6.5     Total Bilirubin 0.0 - 1.2 mg/dL 0.5     Alkaline Phos 38 - 126 U/L 30     AST 15 - 41 U/L 20     ALT 0 - 44 U/L 24        RADIOGRAPHIC STUDIES: I have personally reviewed the radiological images as listed and agreed with the findings in the report. DG SWALLOW FUNC OP MEDICARE SPEECH PATH Result Date: 05/17/2023 Table formatting from the original result was not included. Modified Barium Swallow Study Patient Details Name: Siria Calandro MRN: [id number] Date of Birth: 01/23/1955 Today's Date: 05/17/2023 HPI/PMH: HPI: Mishika is a 68 yo F referred for MBSS to evaluate swallow physiology. Pt admitted to hospital 04/17/2023 with symptomatic bilateral PE. Pt reports chronic cough ongoing for 3-4 months. She denies difficulty with mastication, denies globus sensation, endorses coughing with foods and liquids. She reports occasional sensation of aspiration with saliva. She is not avoiding any particular food or liquids but does tell SLP she is avoidant overall d/t fears related to swallowing. she has lost about 13 lbs. She has esophogram  04/23/2023 demonstrating hiatal hernia, moderate dysmotility with tertiary contractions, delayed esophageal clearance. Pt reports no changes in cough or dysphagia symptoms. Pt does have reflux, is prescribed PPI which she takes 1x day in the morning. Clinical Impression: Clinical Impression: Pt presents with mild oropharyngeal dysphagia (DIGEST score 1) and is suggested for regular solid and thin liquid diet. Oral phase of swallow c/b timely and complete mastication, adequate lingual control and motion for directed bolus hold and a/p transit of liquid and solid boluses. Mild oral reside  remains on tongue. Pharyngeal swallow is initiated at level of pyriform sinuses with liquids and valleculae for solids. Pt with reduced laryngeal elevation, tongue base retraction, and UES opening. Mild pharyngeal residue in valleculae, pyriform sinuses, tongue base. Pt occasionally manages residue with spontaneous re-swallow which is effective in clearing majority of material.  Other times, residue remains with SLP cueing for re-swallow. X1 instance of penetration to vocal folds, prior to swallow initiation. Majority of material ejects from airway during the swallow. With pt complaints of coughing on saliva, known hx of reflux, and demonstration of dysmotility via barium swallow, pt could potentially have post prandial aspiration. Recommend pt follow with OP SLP as planned to address physiologic deficits noted during today's session, complete training for aspiration precautions, and for education on reflux management precautions. Factors that may increase risk of adverse event in presence of aspiration Roderick Civatte & Jessy Morocco 2021): Factors that may increase risk of adverse event in presence of aspiration Roderick Civatte &  Jessy Morocco 2021): Respiratory or GI disease; Weak cough Recommendations/Plan: Swallowing Evaluation Recommendations Swallowing Evaluation Recommendations Recommendations: PO diet PO Diet Recommendation: Regular; Thin liquids (Level 0) Liquid Administration via: Cup; Straw Medication Administration: Whole meds with liquid Supervision: Patient able to self-feed Swallowing strategies  : Minimize environmental distractions; Slow rate; Small bites/sips Postural changes: Position pt fully upright for meals; Stay upright 30-60 min after meals Oral care recommendations: Oral care BID (2x/day) Treatment Plan Treatment Plan Treatment recommendations: Defer treatment plan to SLP at other venue (see follow-up recommendations) Follow-up recommendations: Outpatient SLP Functional status assessment: Patient has had a recent  decline in their functional status and demonstrates the ability to make significant improvements in function in a reasonable and predictable amount of time. Recommendations Recommendations for follow up therapy are one component of a multi-disciplinary discharge planning process, led by the attending physician.  Recommendations may be updated based on patient status, additional functional criteria and insurance authorization. Assessment: Orofacial Exam: Orofacial Exam Oral Cavity: Oral Hygiene: WFL Oral Cavity - Dentition: Adequate natural dentition Orofacial Anatomy: WFL Oral Motor/Sensory Function: WFL Anatomy: Anatomy: Suspected cervical osteophytes Boluses Administered: Boluses Administered Boluses Administered: Thin liquids (Level 0); Mildly thick liquids (Level 2, nectar thick); Moderately thick liquids (Level 3, honey thick); Puree; Solid  Oral Impairment Domain: Oral Impairment Domain Lip Closure: No labial escape Tongue control during bolus hold: Cohesive bolus between tongue to palatal seal Bolus preparation/mastication: Timely and efficient chewing and mashing Bolus transport/lingual motion: Brisk tongue motion Oral residue: Residue collection on oral structures Location of oral residue : Tongue Initiation of pharyngeal swallow : Pyriform sinuses  Pharyngeal Impairment Domain: Pharyngeal Impairment Domain Soft palate elevation: No bolus between soft palate (SP)/pharyngeal wall (PW) Laryngeal elevation: Partial superior movement of thyroid cartilage/partial approximation of arytenoids to epiglottic petiole Anterior hyoid excursion: Complete anterior movement Epiglottic movement: Complete inversion Laryngeal vestibule closure: Complete, no air/contrast in laryngeal vestibule Pharyngeal stripping wave : Present - diminished Pharyngeal contraction (A/P view only): N/A Pharyngoesophageal segment opening: Partial distention/partial duration, partial obstruction of flow Tongue base retraction: Narrow column of  contrast or air between tongue base and PPW Pharyngeal residue: Collection of residue within or on pharyngeal structures Location of pharyngeal residue: Diffuse (>3 areas)  Esophageal Impairment Domain: Esophageal Impairment Domain Esophageal clearance upright position: -- (not tested, recent barium swallow done) Pill: Pill Consistency administered: Thin liquids (Level 0) Thin liquids (Level 0): [facility] Penetration/Aspiration Scale Score: Penetration/Aspiration Scale Score 1.  Material does not enter airway: Mildly thick liquids (Level 2, nectar thick); Moderately thick liquids (Level 3, honey thick); Puree; Solid; Pill; Thin liquids (Level 0) 4.  Material enters airway, CONTACTS cords then ejected out: Thin liquids (Level 0) (seen with initial spoon sip only) Compensatory Strategies: Compensatory Strategies Compensatory strategies: No   General Information: Caregiver present: No  Diet Prior to this Study: Regular; Thin liquids (Level 0)   Temperature : Normal   Respiratory Status: WFL   Supplemental O2: None (Room air)   History of Recent Intubation: No  Behavior/Cognition: Alert; Cooperative Self-Feeding Abilities: Able to self-feed Baseline vocal quality/speech: Normal Volitional Cough: Able to elicit Volitional Swallow: Able to elicit Exam Limitations: No limitations Goal Planning: Prognosis for improved oropharyngeal function: Good Barriers to Reach Goals: -- (none evidenced) No data recorded Patient/Family Stated Goal: safety when eating and drinking Consulted and agree with results and recommendations: Patient Pain: Pain Assessment Pain Assessment: No/denies pain End of Session: Start Time:SLP Start Time (ACUTE ONLY): 1119 Stop Time: SLP Stop Time (ACUTE ONLY):  1136 Time Calculation:SLP Time Calculation (min) (ACUTE ONLY): 17 min Charges: SLP Evaluations $ SLP Speech Visit: 1 Visit SLP Evaluations $Outpatient MBS Swallow: 1 Procedure SLP visit diagnosis: SLP Visit Diagnosis: Dysphagia, oropharyngeal phase  (R13.12) Past Medical History: Past Medical History: Diagnosis Date  Amputee, below knee, right (HCC)   Fracture of multiple ribs 04/19/2017  L ribs 5-10 closed fractures  Pneumothorax on left 04/19/2017  s/p fall from horse Past Surgical History: No past surgical history on file. Alston Jerry 05/17/2023, 12:55 PM CLINICAL DATA:  Patient is a 68 y/o female with history of dysphagia. Patient presents for a modified barium swallow study at the request of Tami Falcon, MD. EXAM: MODIFIED BARIUM SWALLOW TECHNIQUE: Different consistencies of barium were administered orally to the patient by the Speech Pathologist. Imaging of the pharynx was performed in the lateral projection. Marissa Caperilla PA-C was present in the fluoroscopy room during this study, which was supervised and interpreted by Dr. Wilnette Haste Radiologist, not in attendance for the exam. FLUOROSCOPY: Radiation Exposure Index (as provided by the fluoroscopic device): 4.39 mGy Kerma COMPARISON:  DG esophagus 04/23/23. FINDINGS: Vestibular Penetration: Penetration to the cords. No aspiration visualized. Aspiration:  None seen. Other: 13 mm barium tablet administered and passed through esophagus. This is noted on image #16. Early spillage. IMPRESSION: Negative for aspiration. Positive for penetration to the cords. Early spillage of the barium bolus. Please refer to the Speech Pathologists report for complete details and recommendations. Electronically Signed   By: Myrlene Asper D.O.   On: 05/17/2023 12:21   ASSESSMENT & PLAN:  68 y.o. female with:  Bilateral lower lobe segmental and subsegmental pulmonary emboli. Hyperthyroidism - Free T4 is quickly improving from 4.2 to 2.3  PLAN:  -CT Angio Chest PE scan shows findings positive for bilateral lower lobe segmental and subsegmental pulmonary emboli. -discussed importance of determining whether her blood clotting event is a triggered event or non triggered event, as it would determine how long she  should be on blood thinners -educated patient that there are generally several potential reasons for blood clots , including from acquired clotting disorder, absnormalities in blood cells like polycythemia, excessive platelets, and tissue factor -Patient does not have an absolute defined definitive provoking event to explain her blood clots. However, there are multiple soft contibuting factors including long distance travel, fall, injury, infection, and possible steroid use -continue eliquis  for at least 6 months  -If there are no obvious findings on lab tests, at 6 month mark, if there is no evolution of symptoms, we can consider intermediate approach of preventative dose of blood thinners or baby aspirin. Also discussed option of continuing Eliquis  at 69-month mark for highest level of protection from blood clot standpoint.  -there is no suggestion of an inherited blood clotting disorder -patient was tested in the hospital for common mutations, including Factor 5 Leiden and prothrombin gene mutation, which were negative -cardiolipin antibodies negative, beta-2  glycoprotein negative, Lupus anticoagulant positive -discussed that her Lupus anticoagulant testing is likely to be false positive while being on blood thinners. Discussed that we will confirm this with repeat testing 12 weeks apart.  -will complete hypercoagulable workup -will order additional blood tests including Protein C, Protein S, and Antithrombin III  -Her free T4 has quickly improved from 4.2 on 5/7 to 2.3 on 5/19. -possible that she may have absorb thyroid through the skin when handling thyroid powder, causing her thyroid levels to be higher -recommend avoiding exogenous thyroid exposure with bare hands. Recommend wearing  gloves when handling thyroid powder -discussed that hyperthyroidism can be dehydrating and potentially be additional risk factor for blood clots -discussed that her high thyroid levels can explain her weight  loss -patient is agreeable to repeat thyroid function -Return to clinic in 4-6 months -recommend staying UTD with age-appropriate cancers screenings with PCP -answered all of patient's questions in detail  . Orders Placed This Encounter  Procedures   CBC with Differential (Cancer Center Only)    Standing Status:   Future    Number of Occurrences:   1    Expected Date:   06/12/2023    Expiration Date:   06/11/2024   CMP (Cancer Center only)    Standing Status:   Future    Number of Occurrences:   1    Expected Date:   06/12/2023    Expiration Date:   06/11/2024   Antithrombin III     Standing Status:   Future    Number of Occurrences:   1    Expected Date:   06/12/2023    Expiration Date:   06/11/2024   Protein C activity    Standing Status:   Future    Number of Occurrences:   1    Expected Date:   06/12/2023    Expiration Date:   06/11/2024   Protein C, total    Standing Status:   Future    Number of Occurrences:   1    Expected Date:   06/12/2023    Expiration Date:   06/11/2024   Protein S activity    Standing Status:   Future    Number of Occurrences:   1    Expected Date:   06/12/2023    Expiration Date:   06/11/2024   Protein S, total    Standing Status:   Future    Number of Occurrences:   1    Expected Date:   06/12/2023    Expiration Date:   06/11/2024   T4, free    Standing Status:   Future    Number of Occurrences:   1    Expected Date:   06/12/2023    Expiration Date:   06/11/2024   TSH    Standing Status:   Future    Number of Occurrences:   1    Expected Date:   06/12/2023    Expiration Date:   06/11/2024   Miscellaneous [facility] test (send-out)    FOLLOW-UP: Labs today Phone visit with Dr Salomon Cree in 2 weeks  The total time spent in the appointment was 60 minutes* .  All of the patient's questions were answered with apparent satisfaction. The patient knows to call the clinic with any problems, questions or concerns.   Jacquelyn Matt MD MS AAHIVMS [facility]  [facility] Hematology/Oncology Physician [facility]  .*Total Encounter Time as defined by the Centers for Medicare and Medicaid Services includes, in addition to the face-to-face time of a patient visit (documented in the note above) non-face-to-face time: obtaining and reviewing outside history, ordering and reviewing medications, tests or procedures, care coordination (communications with other health care professionals or caregivers) and documentation in the medical record.    I,Mitra Faeizi,acting as a Neurosurgeon for Jacquelyn Matt, MD.,have documented all relevant documentation on the behalf of Jacquelyn Matt, MD,as directed by  Jacquelyn Matt, MD while in the presence of Jacquelyn Matt, MD.  .I have reviewed the above documentation for accuracy and completeness, and I agree with the above. .[place] Alexa Kishore Kyanne Rials  MD

## 2023-06-13 LAB — PROTEIN S, TOTAL: Protein S Ag, Total: 89 % (ref 60–150)

## 2023-06-13 LAB — PROTEIN S ACTIVITY: Protein S Activity: 82 % (ref 63–140)

## 2023-06-13 LAB — PROTEIN C ACTIVITY: Protein C Activity: 141 % (ref 73–180)

## 2023-06-13 LAB — TSH: TSH: <0.01 u[IU]/mL — ABNORMAL LOW (ref 0.350–4.500)

## 2023-06-14 LAB — PROTEIN C, TOTAL: Protein C, Total: 118 % (ref 60–150)

## 2023-06-20 ENCOUNTER — Encounter

## 2023-06-20 DIAGNOSIS — R0683 Snoring: Secondary | ICD-10-CM

## 2023-06-25 ENCOUNTER — Inpatient Hospital Stay: Admitting: Internal Medicine

## 2023-06-26 LAB — MISC LABCORP TEST (SEND OUT): Labcorp test code: 500309

## 2023-07-01 DIAGNOSIS — R0683 Snoring: Secondary | ICD-10-CM | POA: Diagnosis not present

## 2023-07-06 ENCOUNTER — Inpatient Hospital Stay: Attending: Hematology | Admitting: Hematology

## 2023-07-06 DIAGNOSIS — Z7901 Long term (current) use of anticoagulants: Secondary | ICD-10-CM | POA: Insufficient documentation

## 2023-07-06 DIAGNOSIS — Z803 Family history of malignant neoplasm of breast: Secondary | ICD-10-CM | POA: Diagnosis not present

## 2023-07-06 DIAGNOSIS — R7989 Other specified abnormal findings of blood chemistry: Secondary | ICD-10-CM

## 2023-07-06 DIAGNOSIS — Z86711 Personal history of pulmonary embolism: Secondary | ICD-10-CM | POA: Diagnosis present

## 2023-07-06 DIAGNOSIS — D649 Anemia, unspecified: Secondary | ICD-10-CM | POA: Diagnosis not present

## 2023-07-06 DIAGNOSIS — I2699 Other pulmonary embolism without acute cor pulmonale: Secondary | ICD-10-CM | POA: Diagnosis not present

## 2023-07-06 NOTE — Progress Notes (Signed)
 HEMATOLOGY/ONCOLOGY PHONE VISIT NOTE  Date of Service: 07/06/2023  Patient Care Team: System, Provider Not In as PCP - General  CHIEF COMPLAINTS/PURPOSE OF CONSULTATION:   Hx of PE  HISTORY OF PRESENTING ILLNESS:  Shannon Henderson is a wonderful 68 y.o. female who has been referred to us  by Desai, Nikita S, MD for evaluation and management of Hx of PE.  Patient was hospitalized in April for acute bilateral PE. She reports that she has never had blood clots prior to her recent event in April.   She is noted to have had long-distance travel to [place]  2 weeks prior to her blood clot event. Patient notes that she took frequent breaks during her travel to [place]  and denies sitting for 4 hours at a time.   Patient reports having a fall due to slipping, into the bathtub onto her right hip 1 week prior to her blood clot causing a huge bruise in the area.   Patient denies any history of HTN. She is on Atenolol for management of elevated heart rate. She notes an event of elevated resting HR of 110 bpm. Patient notes that her heart rate was mildly high prior to her blood clot.   She reports chronic cough, swallowing issue and follows with Shannon Henderson. She also reports working with a Shannon Henderson. Patient has no other chronic medical issues.   She generally stays well-hydrated. And denies any concerns with being dehydrated at any point.   Her free T4 has quickly improved from 4.2  to 2.3 in the last 1-2 weeks. She has never had thyroid  issues in the past.   Patient reports losing 15+ pounds in the last couple of months. She notes previously weighing 178 pounds prior to traveling to [place] , and 162 pounds currently. She reports normal eating habits though she continues to lose weight.   She reports previous persistent cough thought to be related to aspiration, which has improved.  She denies any fhx of blood clots. Patient denies any fhx of blood clotting disorder. She denies any hx  of blood cotting disorder.   She reports having several surgeries in the past, none of which have triggered a blood clot. Her most recent surgery was in 2011.   She was previously on birth control pill a long time ago for 3 years, which never triggered a blood clot.  She notes that she frequently travels, including traveling to [place] twice as well as [place].   She has an upcoming sleep study. Patient has been wearing a snore guard as recommended by her dentisit.   Patient has a very physically active lifestyle, including walking 15,000 steps daily and playing pickleball. 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 Patient reports that she is close to her baseline functioning at this time.   Patient has never been pregnant . She is not a smoker. She notes that her father was a heavy smoker.   She denies any considerations that would her at higher risk of bleeding, such as recurrent nose bleeds or stomach ulcers.   She denies any change in bowel habits, new lumps/bumps or abdominal pain.   Patient reports that her previous symptoms, which have all nearly resolved or are improving, include lots of fatigue, hand and leg shaking, leg swelling, elevated HR, weakness, coughing, and aspirating. She notes that her pain began with back pain, though most of her pain has resolved at this time. Her SOB has resolved.   She reports that she is  taking oral iron for anemia.   She reports that Shannon Henderson refilled her Eliquis  last week.  She reports that 1 week before her blood clot, she was on prednisone and antibiotics due to treating sinus infection.   She last had a colonoscopy at age 85  She denies any fhx of blood disorder.   Patient reports that her father had hx of end stage COPD from hisotory of smoking. Her mother has hx of breast cancer diagnosed at age 12. There is no other fhx of cancer.   She has an upcoming appointment with her endocrinologist next Wednesday.   INTERVAL  HISTORY:  Shannon Henderson is a 68 y.o. female who is being connected with via telemedicine visit for her Hx of PE.  She was initially seen by me on 06/12/2023 and reported an elevated resting HR, chronic cough, swallowing issue, and weight loss of 15+ pounds.    I connected with [name] on 07/06/23 at  3:30 PM EDT by telephone visit and verified that I am speaking with the correct person using two identifiers.   I discussed the limitations, risks, security and privacy concerns of performing an evaluation and management service by telemedicine and the availability of in-person appointments. I also discussed with the patient that there may be a patient responsible charge related to this service. The patient expressed understanding and agreed to proceed.   Other persons participating in the visit and their role in the encounter: none   Patient'[place] location: home  Provider'[place] location: [facility]   Chief Complaint: Hx of PE    She reports no new concerns since her last clinical visit.   Patient has no pain or swelling in her left leg at this time. She has no chest pain or SOB.   Her PCP is Shannon Agent, PA-C.   She reports that she is on a beta blocker managed by her PCP for elevated resting HR of 110 bpm. She reports that her resting heart rate is 60 bpm now. Patient will see her PCP next at the end of July.   She reports that she will be receiving blood work on Monday, 07/09/2023, with endocrinology.   Patient reports that she has not needed any treatment for her hyperthyroidism.   She has been handling thyroid  powder since January. 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   Patient reports that her weight loss issue has settled down and notes that she has regained 3 pounds recently.   She is on protonix  managed by Shannon Henderson.   She reports that she may have a higher risk of falls compared to the average  person from riding horses, but notes that she is generally careful with horse riding.   She denies any fhx of blood clots or early heart disease.   She notes that she feels great overall after not doing so well for a long time previously.   The results of her recent lab workup was discussed with her in detail.   MEDICAL HISTORY:  Past Medical History:  Diagnosis Date   Amputee, below knee, right (HCC)    Fracture of multiple ribs 04/19/2017   L ribs 5-10 closed fractures   Pneumothorax on left 04/19/2017   [place]/p fall from horse    SURGICAL HISTORY: No past surgical history on file.  SOCIAL HISTORY: Social History   Socioeconomic History   Marital status: Married    Spouse name:  Not on file   Number of children: Not on file   Years of education: Not on file   Highest education level: Not on file  Occupational History   Not on file  Tobacco Use   Smoking status: Never    Passive exposure: Past   Smokeless tobacco: Never  Substance and Sexual Activity   Alcohol use: Never   Drug use: Never   Sexual activity: Not on file  Other Topics Concern   Not on file  Social History Narrative   Not on file   Social Drivers of Health   Financial Resource Strain: Not on file  Food Insecurity: Low Risk  (06/04/2023)   Received from [facility]   Hunger Vital Sign    Within the past 12 months, you worried that your food would run out before you got money to buy more: Never true    Within the past 12 months, the food you bought just didn't last and you didn't have money to get more. : Never true  Transportation Needs: No Transportation Needs (06/04/2023)   Received from [facility]    In the past 12 months, has lack of reliable transportation kept you from medical appointments, meetings, work or from getting things needed for daily living? : No  Physical Activity: Not on file  Stress: Not on file  Social Connections: Socially Integrated (04/17/2023)   Social  Connection and Isolation Panel    Frequency of Communication with Friends and Family: More than three times a week    Frequency of Social Gatherings with Friends and Family: More than three times a week    Attends Religious Services: More than 4 times per year    Active Member of [employer] or Organizations: Yes    Attends Banker Meetings: More than 4 times per year    Marital Status: Married  Catering manager Violence: Not At Risk (04/17/2023)   Humiliation, Afraid, Rape, and Kick questionnaire    Fear of Current or Ex-Partner: No    Emotionally Abused: No    Physically Abused: No    Sexually Abused: No    FAMILY HISTORY: Family History  Problem Relation Age of Onset   Breast cancer Mother 93    ALLERGIES:  has no known allergies.  MEDICATIONS:  Current Outpatient Medications  Medication Sig Dispense Refill   acetaminophen -codeine  (TYLENOL  #3) 300-30 MG tablet Take 1 tablet by mouth every 4 (four) hours as needed for moderate pain (pain score 4-6) or severe pain (pain score 7-10).     apixaban  (ELIQUIS ) 5 MG TABS tablet Take 1 tablet (5 mg total) by mouth 2 (two) times daily. Start after you finish starter pack 60 tablet 3   APIXABAN  (ELIQUIS ) VTE STARTER PACK (10MG  AND 5MG ) Take as directed on package: start with two-5mg  tablets twice daily for 7 days. On day 8, switch to one-5mg  tablet twice daily. 74 each 0   atenolol (TENORMIN) 25 MG tablet Take 25 mg by mouth daily.     b complex vitamins tablet Take 1 tablet by mouth daily.     benzonatate (TESSALON) 200 MG capsule Take 200 mg by mouth 2 (two) times daily as needed for cough.     CALCIUM PO Take 250 mg by mouth 2 (two) times daily.     fluticasone  (FLONASE ) 50 MCG/ACT nasal spray Place 1 spray into both nostrils daily. 10 each 1   HYDROcodone  bit-homatropine (HYCODAN) 5-1.5 MG/5ML syrup Take 5 mLs by mouth  every 6 (six) hours as needed for cough. 120 mL 0   [Paused] ibandronate (BONIVA) 150 MG tablet Take 150 mg by  mouth every 30 (thirty) days.     loratadine  (CLARITIN ) 10 MG tablet Take 1 tablet (10 mg total) by mouth daily. 30 tablet 1   Magnesium Chloride (MAGNESIUM DR PO) Take 1 capsule by mouth daily at 12 noon.     mirtazapine  (REMERON ) 15 MG tablet Take 15 mg by mouth at bedtime.  3   Multiple Vitamin (MULTIVITAMIN) capsule Take 1 capsule by mouth daily.     Omega-3 Fatty Acids (COROMEGA OMEGA 3 SQUEEZE) EMUL Take 1 capsule by mouth daily.     oxyCODONE  (OXY IR/ROXICODONE ) 5 MG immediate release tablet Take 1 tablet (5 mg total) by mouth every 6 (six) hours as needed for severe pain. 20 tablet 0   pantoprazole  (PROTONIX ) 40 MG tablet Take 1 tablet (40 mg total) by mouth daily. 90 tablet 3   SUMAtriptan  (IMITREX ) 100 MG tablet Take 100 mg by mouth every 2 (two) hours as needed for headache or migraine.     No current facility-administered medications for this visit.    REVIEW OF SYSTEMS:    10 Point review of Systems was done is negative except as noted above.  PHYSICAL EXAMINATION: Telemedicine visit.  LABORATORY DATA:  I have reviewed the data as listed  .    Latest Ref Rng & Units 06/12/2023    4:00 PM 04/22/2023    3:38 AM 04/21/2023    3:50 AM  CBC  WBC 4.0 - 10.5 K/uL 4.9  6.0  5.6   Hemoglobin 12.0 - 15.0 g/dL 87.5  89.6  9.9   Hematocrit 36.0 - 46.0 % 37.7  32.2  32.0   Platelets 150 - 400 K/uL 283  252  232     .    Latest Ref Rng & Units 06/12/2023    4:00 PM 04/22/2023    3:38 AM 04/20/2023    3:59 AM  CMP  Glucose 70 - 99 mg/dL 90  898  94   BUN 8 - 23 mg/dL 16  19  24    Creatinine 0.44 - 1.00 mg/dL 9.43  9.39  9.52   Sodium 135 - 145 mmol/L 142  138  136   Potassium 3.5 - 5.1 mmol/L 4.0  3.9  4.0   Chloride 98 - 111 mmol/L 107  106  105   CO2 22 - 32 mmol/L 27  24  21    Calcium 8.9 - 10.3 mg/dL 9.4  9.0  8.6   Total Protein 6.5 - 8.1 g/dL 6.5     Total Bilirubin 0.0 - 1.2 mg/dL 0.5     Alkaline Phos 38 - 126 U/L 30     AST 15 - 41 U/L 20     ALT 0 - 44 U/L 24         RADIOGRAPHIC STUDIES: I have personally reviewed the radiological images as listed and agreed with the findings in the report. No results found.   ASSESSMENT & PLAN:  68 y.o. female with:  Bilateral lower lobe segmental and subsegmental pulmonary emboli. Hyperthyroidism - Free T4 is quickly improving from 4.2 to 2.3  PLAN:  -Discussed lab results from 06/12/2023 in detail with patient. CBC normal, showed WBC of 4.9K, hemoglobin of 12.4, and platelets of 283K. -CMP normal -Protein C normal -Protein Henderson normal -Antithrombin III  normal -She had some of her workup done during her hospitalization, and her  common genetic workup was unrevealing.  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 Discussed that this result can sometimes disappear. Discussed that there is some uncertainty in regards to whether his positive lupus anticoagulant testing is a real risk factor or not. There is a need to repeat lupus anticoagulant testing at least 3 months later (preferably closer to 6 months when we can hold her blood thinners for a couple days) to see if it is a risk factor or not -discussed that if may be useful to know whether lupus anticoagulant is an additional risk factor for blood clots if there is a situation where she needs to be off of blood thinners in the future, as it may help with risk stratification.  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  -educated patient that after a blood clot forms, the clot is shaped by enzymes so it does not overgrow.  -discussed that the 4G/4G  polymorphism of the PAI-1 gene is a risk factor for early heart disease and venous blood clots and also can cause pregnancy loss in younger individuals.  -she also has a few other softer contributing risk factors for blood clots, including long-distance travel, fall/injury, and steroid use -discussed that even if lupus anticoagulant is found to be negative after 6 months, we would still recommend blood thinners long-term since she has an ongoing genetic factor of 4G/4G polymorphism and fairly extensive blood clot in the lung since the risk of repeat blood clots remains elevated -discussed benefits and risks with long-term blood thinners -recommend cardiac risk reduction with heart-healthy lifestyle -discussed that factors including blood clot in the lung and hyperthyroid can increase resting heart rate -there is no suggestion of chronic hyperthyroidism. She more likely appears to have thyroiditis -free T4 level has normalized -TSH level is still suppressed -since her thyroid  levels have normalized, I would recommend patient to connect with her PCP to reassess whether patient needs to continue beta blocker -recommend continuing to wear gloves while handling thyroid  powder  -answered all of patient'[place] questions in detail -will discharge patient to her PCP -there is no strong recommendation for checking all her family members with broader testing for PAI-1 gene mutation in the absence of presentation of repeat clots or early heart disease -She is on protonix  managed by Shannon Henderson and her prescribing physician shall decide whether patient should continue this medication.   FOLLOW-UP: RTC with PCP  The total time spent in the appointment was 21 minutes* .  All of the patient'[place] questions were answered with apparent satisfaction. The patient knows to call the clinic with any problems, questions or concerns.   Emaline Saran MD MS AAHIVMS [facility] Hematology/Oncology Physician [facility]  .*Total Encounter Time as defined by the Centers for Medicare and Medicaid Services includes, in addition to the face-to-face time of a patient visit (documented in the note above) non-face-to-face time: obtaining and reviewing outside history, ordering and reviewing medications, tests or procedures, care coordination (communications with other health care professionals or caregivers) and documentation in the medical record.    I,Mitra Faeizi,acting as a Neurosurgeon for [employer],  MD.,have documented all relevant documentation on the behalf of Emaline Saran, MD,as directed by  Emaline Saran, MD while in the presence of Emaline Saran, MD.  .I have reviewed the above documentation for accuracy and completeness, and I agree with the above. .Shantele Reller Kishore Xitlaly Ault MD

## 2023-07-10 ENCOUNTER — Ambulatory Visit: Payer: Self-pay | Attending: Family Medicine

## 2023-07-10 DIAGNOSIS — R1312 Dysphagia, oropharyngeal phase: Secondary | ICD-10-CM | POA: Diagnosis present

## 2023-07-10 NOTE — Therapy (Signed)
 OUTPATIENT SPEECH LANGUAGE PATHOLOGY SWALLOW TREATMENT   Patient Name: Shannon Henderson MRN: [id number] DOB:02/10/1955, 68 y.o., female Today's Date: 07/10/2023  PCP: Not in system REFERRING PROVIDER: Lynwood Laneta ORN, PA-C  END OF SESSION:  End of Session - 07/10/23 1335     Visit Number 2    Number of Visits 5    Date for SLP Re-Evaluation 08/14/23    Authorization Type [employer]    SLP Start Time 1340    SLP Stop Time  1425    SLP Time Calculation (min) 45 min    Activity Tolerance Patient tolerated treatment well           Past Medical History:  Diagnosis Date   Amputee, below knee, right (HCC)    Fracture of multiple ribs 04/19/2017   L ribs 5-10 closed fractures   Pneumothorax on left 04/19/2017   s/p fall from horse   History reviewed. No pertinent surgical history. Patient Active Problem List   Diagnosis Date Noted   Acute pulmonary embolism (HCC) 04/17/2023   Rib fractures 04/19/2017    ONSET DATE: 05/03/2023 (referral date)   REFERRING DIAG: R93.3 (ICD-10-CM) - Abnormal findings on diagnostic imaging of other parts of digestive tract  THERAPY DIAG: Dysphagia, oropharyngeal phase  Rationale for Evaluation and Treatment: Rehabilitation  SUBJECTIVE:   SUBJECTIVE STATEMENT: I'm human again  Pt accompanied by: self  PERTINENT HISTORY: Shannon Henderson is a 68 yo F referred for MBSS to evaluate swallow physiology. Pt admitted to hospital 04/17/2023 with symptomatic bilateral PE. Pt reports chronic cough ongoing for 3-4 months. She denies difficulty with mastication, denies globus sensation, endorses coughing with foods and liquids. She reports occasional sensation of aspiration with saliva. She is not avoiding any particular food or liquids but does tell SLP she is avoidant overall d/t fears related to swallowing. she has lost about 13 lbs. She has esophogram 04/23/2023 demonstrating hiatal hernia, moderate dysmotility with tertiary contractions, delayed esophageal clearance.  Pt reports no changes in cough or dysphagia symptoms. Pt does have reflux, is prescribed PPI which she takes 1x day in the morning.   PAIN: Are you having pain? No  FALLS: Has patient fallen in last 6 months?  Yes  LIVING ENVIRONMENT: Lives with: lives with their family Lives in: House/apartment  PLOF:  Level of assistance: Independent with ADLs, Independent with IADLs Employment: Full-time employment  PATIENT GOALS: improve safety during swallowing  OBJECTIVE:  Note: Objective measures were completed at Evaluation unless otherwise noted. OBJECTIVE:  INSTRUMENTAL SWALLOW STUDY FINDINGS (MBSS) 05/17/23 Pt presents with mild oropharyngeal dysphagia (DIGEST score 1) and is suggested for regular solid and thin liquid diet. Oral phase of swallow c/b timely and complete mastication, adequate lingual control and motion for directed bolus hold and a/p transit of liquid and solid boluses. Mild oral reside remains on tongue. Pharyngeal swallow is initiated at level of pyriform sinuses with liquids and valleculae for solids. Pt with reduced laryngeal elevation, tongue base retraction, and UES opening. Mild pharyngeal residue in valleculae, pyriform sinuses, tongue base. Pt occasionally manages residue with spontaneous re-swallow which is effective in clearing majority of material. Other times, residue remains with SLP cueing for re-swallow. X1 instance of penetration to vocal folds, prior to swallow initiation. Majority of material ejects from airway during the swallow. With pt complaints of coughing on saliva, known hx of reflux, and demonstration of dysmotility via barium swallow, pt could potentially have post prandial aspiration. Recommend pt follow with OP SLP as planned to address physiologic deficits  noted during today's session, complete training for aspiration precautions, and for education on reflux management precautions.    COGNITION: Overall cognitive status: Within functional limits for  tasks assessed  SUBJECTIVE DYSPHAGIA REPORTS:  Date of onset: since hospitalization Reported symptoms: coughing with both solids and liquids and globus sensation  Current diet: regular and thin liquids  Co-morbid voice changes: No  FACTORS WHICH MAY INCREASE RISK OF ADVERSE EVENT IN PRESENCE OF ASPIRATION:  General health: well appearing  Risk factors: none evident     ORAL MOTOR EXAMINATION: Overall status: WFL  CLINICAL SWALLOW ASSESSMENT:   Dentition: adequate natural dentition Vocal quality at baseline: normal Patient directly observed with POs: Yes: thin liquids  Feeding: able to feed self Liquids provided by: cup Oral phase signs and symptoms: none Pharyngeal phase signs and symptoms: none with thin liquids today  PATIENT REPORTED OUTCOME MEASURES (PROM): EAT-10: 12  Question Patient's Response  My swallowing problem has caused me to lose weight 3  2.  My swallowing problem interferes with my ability to go out to meals 0  3.  Swallowing liquids takes extra effort 1  4.  Swallowing solids takes extra effort 1  5.  Swallowing pills takes extra effort 0  6.  Swallowing is painful 0  7.  The pleasure of eating is affected by my swallowing 2  8.  When I swallow food sticks in my throat 2  9.  I cough when I eat 1  10.  Swallowing is stressful  2  0= No problem 4= Severe problem                                                                                        TREATMENT DATE:  07/10/23: Reported occasional mild aspiration episodes with variety of textures. Denied any change in respiratory status indicating concern for aspiration PNA. Endorsed consistent completion of HEP, with request to review Mendelsohn maneuver for accurate completion. Provided occasional verbal and tactile cues to optimize completion. Provided additional recommendations for swallow precautions and reflux management, see patient instructions.   06/05/23: ST evaluation and POC complete. Initiated  education and instruction of swallow exercises to address aforementioned deficits, including effortful swallow, Masako swallow, Mendelsohn, and CTAR. Pt able to complete exercises with rare min A. Updated HEP, with handout provided.    PATIENT EDUCATION: Education details: see above Person educated: Patient Education method: Explanation, Demonstration, and Handouts Education comprehension: verbalized understanding, returned demonstration, and needs further education   ASSESSMENT:  CLINICAL IMPRESSION: Patient is a 68 y.o. F who was seen today for ST evaluation d/t mild oropharyngeal dysphagia evidenced on MBSS in early May 2025. Continued education and instruction of swallow exercises and precautions to address deficits identified on MBSS. Handout provided with recommendations. Pt would benefit from short course of skilled ST intervention to optimize swallow safety and decrease risk of aspiration.    OBJECTIVE IMPAIRMENTS: include dysphagia. These impairments are limiting patient from safety when swallowing. Factors affecting potential to achieve goals and functional outcome are none. Patient will benefit from skilled SLP services to address above impairments and improve overall function.  REHAB POTENTIAL: Good   GOALS:  Goals reviewed with patient? Yes  LONG TERM GOALS: Target date: 08/14/2023 (STG=LTGs; extended for scheduling delays)  Pt will complete daily HEP > 1 week  Baseline:  Goal status: MET  2.  Pt will demonstrate recommended swallow exercises given mod I  Baseline:  Goal status: ONGOING  3.  Pt will report improved swallow function via PROM by 2 points at LTG date Baseline: EAT-10=12 Goal status: ONGOING  PLAN:  SLP FREQUENCY: 1x/week  SLP DURATION: 10 weeks (extended for scheduling)  PLANNED INTERVENTIONS: Aspiration precaution training, Pharyngeal strengthening exercises, Diet toleration management , Cueing hierachy, SLP instruction and feedback, Compensatory  strategies, and 07473 Treatment of swallowing function    Comer LILLETTE Louder, CCC-SLP 07/10/2023, 2:26 PM

## 2023-07-10 NOTE — Patient Instructions (Addendum)
 Recommendations: Reflux Raft/Gourmet  Make sure you are initiating swallow while doing Claudette  Look in mirror and/or palpating with your finger to give some feedback Be very intentional when you swallow, especially while chugging your drinks

## 2023-08-09 ENCOUNTER — Ambulatory Visit (HOSPITAL_COMMUNITY)
Admission: RE | Admit: 2023-08-09 | Discharge: 2023-08-09 | Disposition: A | Discharge status: Home / Self Care | Source: Ambulatory Visit | Attending: Internal Medicine | Admitting: Internal Medicine

## 2023-08-09 DIAGNOSIS — R600 Localized edema: Secondary | ICD-10-CM | POA: Diagnosis present

## 2023-08-09 DIAGNOSIS — I2699 Other pulmonary embolism without acute cor pulmonale: Secondary | ICD-10-CM | POA: Diagnosis present

## 2023-08-13 ENCOUNTER — Ambulatory Visit: Payer: Self-pay | Admitting: Internal Medicine

## 2023-08-13 NOTE — Therapy (Unsigned)
 OUTPATIENT SPEECH LANGUAGE PATHOLOGY SWALLOW TREATMENT   Patient Name: Shannon Henderson MRN: [id number] DOB:1955-01-22, 68 y.o., female Today's Date: 08/13/2023  PCP: Not in system REFERRING PROVIDER: Lynwood Laneta ORN, PA-C  END OF SESSION:     Past Medical History:  Diagnosis Date   Amputee, below knee, right (HCC)    Fracture of multiple ribs 04/19/2017   L ribs 5-10 closed fractures   Pneumothorax on left 04/19/2017   s/p fall from horse   No past surgical history on file. Patient Active Problem List   Diagnosis Date Noted   Acute pulmonary embolism (HCC) 04/17/2023   Rib fractures 04/19/2017    ONSET DATE: 05/03/2023 (referral date)   REFERRING DIAG: R93.3 (ICD-10-CM) - Abnormal findings on diagnostic imaging of other parts of digestive tract  THERAPY DIAG: No diagnosis found.  Rationale for Evaluation and Treatment: Rehabilitation  SUBJECTIVE:   SUBJECTIVE STATEMENT:  Pt accompanied by: self  PERTINENT HISTORY: Shannon Henderson is a 68 yo F referred for MBSS to evaluate swallow physiology. Pt admitted to hospital 04/17/2023 with symptomatic bilateral PE. Pt reports chronic cough ongoing for 3-4 months. She denies difficulty with mastication, denies globus sensation, endorses coughing with foods and liquids. She reports occasional sensation of aspiration with saliva. She is not avoiding any particular food or liquids but does tell SLP she is avoidant overall d/t fears related to swallowing. she has lost about 13 lbs. She has esophogram 04/23/2023 demonstrating hiatal hernia, moderate dysmotility with tertiary contractions, delayed esophageal clearance. Pt reports no changes in cough or dysphagia symptoms. Pt does have reflux, is prescribed PPI which she takes 1x day in the morning.   PAIN: Are you having pain? No  FALLS: Has patient fallen in last 6 months?  Yes  LIVING ENVIRONMENT: Lives with: lives with their family Lives in: House/apartment  PLOF:  Level of assistance:  Independent with ADLs, Independent with IADLs Employment: Full-time employment  PATIENT GOALS: improve safety during swallowing  OBJECTIVE:  Note: Objective measures were completed at Evaluation unless otherwise noted. OBJECTIVE:  INSTRUMENTAL SWALLOW STUDY FINDINGS (MBSS) 05/17/23 Pt presents with mild oropharyngeal dysphagia (DIGEST score 1) and is suggested for regular solid and thin liquid diet. Oral phase of swallow c/b timely and complete mastication, adequate lingual control and motion for directed bolus hold and a/p transit of liquid and solid boluses. Mild oral reside remains on tongue. Pharyngeal swallow is initiated at level of pyriform sinuses with liquids and valleculae for solids. Pt with reduced laryngeal elevation, tongue base retraction, and UES opening. Mild pharyngeal residue in valleculae, pyriform sinuses, tongue base. Pt occasionally manages residue with spontaneous re-swallow which is effective in clearing majority of material. Other times, residue remains with SLP cueing for re-swallow. X1 instance of penetration to vocal folds, prior to swallow initiation. Majority of material ejects from airway during the swallow. With pt complaints of coughing on saliva, known hx of reflux, and demonstration of dysmotility via barium swallow, pt could potentially have post prandial aspiration. Recommend pt follow with OP SLP as planned to address physiologic deficits noted during today's session, complete training for aspiration precautions, and for education on reflux management precautions.    COGNITION: Overall cognitive status: Within functional limits for tasks assessed  SUBJECTIVE DYSPHAGIA REPORTS:  Date of onset: since hospitalization Reported symptoms: coughing with both solids and liquids and globus sensation  Current diet: regular and thin liquids  Co-morbid voice changes: No  FACTORS WHICH MAY INCREASE RISK OF ADVERSE EVENT IN PRESENCE OF ASPIRATION:  General  health: well  appearing  Risk factors: none evident     ORAL MOTOR EXAMINATION: Overall status: WFL  CLINICAL SWALLOW ASSESSMENT:   Dentition: adequate natural dentition Vocal quality at baseline: normal Patient directly observed with POs: Yes: thin liquids  Feeding: able to feed self Liquids provided by: cup Oral phase signs and symptoms: none Pharyngeal phase signs and symptoms: none with thin liquids today  PATIENT REPORTED OUTCOME MEASURES (PROM): EAT-10: 12  Question Patient's Response  My swallowing problem has caused me to lose weight 3  2.  My swallowing problem interferes with my ability to go out to meals 0  3.  Swallowing liquids takes extra effort 1  4.  Swallowing solids takes extra effort 1  5.  Swallowing pills takes extra effort 0  6.  Swallowing is painful 0  7.  The pleasure of eating is affected by my swallowing 2  8.  When I swallow food sticks in my throat 2  9.  I cough when I eat 1  10.  Swallowing is stressful  2  0= No problem 4= Severe problem                                                                                        TREATMENT DATE:  08/14/23:  07/10/23: Reported occasional mild aspiration episodes with variety of textures. Denied any change in respiratory status indicating concern for aspiration PNA. Endorsed consistent completion of HEP, with request to review Mendelsohn maneuver for accurate completion. Provided occasional verbal and tactile cues to optimize completion. Provided additional recommendations for swallow precautions and reflux management, see patient instructions.   06/05/23: ST evaluation and POC complete. Initiated education and instruction of swallow exercises to address aforementioned deficits, including effortful swallow, Masako swallow, Mendelsohn, and CTAR. Pt able to complete exercises with rare min A. Updated HEP, with handout provided.    PATIENT EDUCATION: Education details: see above Person educated: Patient Education  method: Explanation, Demonstration, and Handouts Education comprehension: verbalized understanding, returned demonstration, and needs further education   ASSESSMENT:  CLINICAL IMPRESSION: Patient is a 68 y.o. F who was seen today for ST evaluation d/t mild oropharyngeal dysphagia evidenced on MBSS in early May 2025. Continued education and instruction of swallow exercises and precautions to address deficits identified on MBSS. Handout provided with recommendations. Pt would benefit from short course of skilled ST intervention to optimize swallow safety and decrease risk of aspiration.    OBJECTIVE IMPAIRMENTS: include dysphagia. These impairments are limiting patient from safety when swallowing. Factors affecting potential to achieve goals and functional outcome are none. Patient will benefit from skilled SLP services to address above impairments and improve overall function.  REHAB POTENTIAL: Good   GOALS: Goals reviewed with patient? Yes  LONG TERM GOALS: Target date: 08/14/2023 (STG=LTGs; extended for scheduling delays)  Pt will complete daily HEP > 1 week  Baseline:  Goal status: MET  2.  Pt will demonstrate recommended swallow exercises given mod I  Baseline:  Goal status: ONGOING  3.  Pt will report improved swallow function via PROM by 2 points at LTG date Baseline: EAT-10=12 Goal status: ONGOING  PLAN:  SLP FREQUENCY: 1x/week  SLP DURATION: 10 weeks (extended for scheduling)  PLANNED INTERVENTIONS: Aspiration precaution training, Pharyngeal strengthening exercises, Diet toleration management , Cueing hierachy, SLP instruction and feedback, Compensatory strategies, and 07473 Treatment of swallowing function    Comer LILLETTE Louder, CCC-SLP 08/13/2023, 1:45 PM

## 2023-08-14 ENCOUNTER — Ambulatory Visit: Payer: Self-pay | Attending: Family Medicine

## 2023-08-14 DIAGNOSIS — R1312 Dysphagia, oropharyngeal phase: Secondary | ICD-10-CM | POA: Diagnosis present

## 2023-09-06 ENCOUNTER — Ambulatory Visit (INDEPENDENT_AMBULATORY_CARE_PROVIDER_SITE_OTHER): Admitting: Internal Medicine

## 2023-09-06 ENCOUNTER — Encounter: Payer: Self-pay | Admitting: Internal Medicine

## 2023-09-06 VITALS — BP 118/70 | HR 62 | Ht 73.0 in | Wt 166.0 lb

## 2023-09-06 DIAGNOSIS — G4733 Obstructive sleep apnea (adult) (pediatric): Secondary | ICD-10-CM

## 2023-09-06 DIAGNOSIS — I2699 Other pulmonary embolism without acute cor pulmonale: Secondary | ICD-10-CM

## 2023-09-06 MED ORDER — APIXABAN 2.5 MG PO TABS: 2.5000 mg | ORAL_TABLET | Freq: Two times a day (BID) | ORAL | 3 refills | Status: AC

## 2023-09-06 MED ORDER — APIXABAN 5 MG PO TABS: 5.0000 mg | ORAL_TABLET | Freq: Two times a day (BID) | ORAL | 0 refills | Status: AC

## 2023-09-06 NOTE — Progress Notes (Signed)
 Shannon Henderson    [id number]    05-08-1955  Primary Care Physician:James, Laneta ORN, PA-C Date of Appointment: 09/06/2023 Established Patient Visit  Chief complaint:   Chief Complaint  Patient presents with   Medical Management of Chronic Issues    Hx of PE. Breathing is doing well. She wants to discuss continuing PPI and also Eliquis .      HPI: Shannon Henderson is a 68 y.o. woman who was hospitalized in April 2025 with provoked acute pulmonary embolism and started on eliqus. And possible OSA treated with oral appliance.   Interval Updates: Here for follow up after home sleep study with and without oral appliance. Fitted by Dr. Glendia Minor  Multiple nights reviewed first night with wearing oral appliance normal sleep. Second night without she had sleep apnea and snoring with desaturation.   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   No adverse effects with eliquis . Has already taken 8000 steps by 10am today.   Continues to ride horses.   Does feel that the pulmonary embolism was a very traumatic event and does not want to ever go through that again.    I have reviewed the patient's family social and past medical history and updated as appropriate.   Past Medical History:  Diagnosis Date   Amputee, below knee, right (HCC)    Fracture of multiple ribs 04/19/2017   L ribs 5-10 closed fractures   Pneumothorax on left 04/19/2017   s/p fall from horse    History reviewed. No pertinent surgical history.  Family History  Problem Relation Age of Onset   Breast cancer Mother 41    Social History   Occupational History   Not on file  Tobacco Use   Smoking status: Never    Passive exposure: Past   Smokeless tobacco: Never  Substance and Sexual Activity   Alcohol use: Never   Drug use: Never   Sexual activity: Not on file     Physical  Exam: Blood pressure 118/70, pulse 62, height 6' 1 (1.854 m), weight 166 lb (75.3 kg), SpO2 99%.  Gen:      No acute distress ENT:  no nasal polyps, mucus membranes moist Lungs:    No increased respiratory effort, symmetric chest wall excursion, clear to auscultation bilaterally, no wheezes or crackles CV:         Regular rate and rhythm; no murmurs, rubs, or gallops.  No pedal edema   Data Reviewed: Imaging: I have personally reviewed the CTPE study April 2025 - acute pe    PFTs:      No data to display          Labs: Lab Results  Component Value Date   WBC 4.9 06/12/2023   HGB 12.4 06/12/2023   HCT 37.7 06/12/2023   MCV 89.8 06/12/2023   PLT 283 06/12/2023   Lab Results  Component Value Date   NA 142 06/12/2023   K 4.0 06/12/2023   CO2 27 06/12/2023   GLUCOSE 90 06/12/2023   BUN 16 06/12/2023   CREATININE 0.56 06/12/2023   CALCIUM 9.4 06/12/2023   GFRNONAA >60 06/12/2023    Immunization status: Immunization History  Administered Date(s) Administered   Fluzone Influenza virus vaccine,trivalent (IIV3), split virus 09/30/2020   Hep A / Hep B 06/07/2011, 06/13/2011, 06/19/2011, 07/18/2017   Influenza, Seasonal, Injecte, Preservative Fre 09/17/2019  Influenza,inj,Quad PF,6+ Mos 09/18/2018   Influenza-Unspecified 09/18/2018, 10/24/2021   PFIZER Comirnaty(Gray Top)Covid-19 Tri-Sucrose Vaccine 03/23/2020   PFIZER(Purple Top)SARS-COV-2 Vaccination 04/07/2019, 05/05/2019, 09/11/2019   PNEUMOCOCCAL CONJUGATE-20 08/08/2022   Pfizer Covid-19 Vaccine Bivalent Booster 32yrs & up 09/30/2020   Pfizer(Comirnaty)Fall Seasonal Vaccine 12 years and older 10/24/2021, 11/01/2022   Td (Adult),5 Lf Tetanus Toxid, Preservative Free 07/18/2017   Tdap 06/12/2006   Zoster Recombinant(Shingrix) 07/29/2019, 10/28/2019    External Records Personally Reviewed: heme onc, hospital stay.   Assessment:  Acute pulmonary embolism, Unprovoked April 2025 OSA controlled with oral  appliance  Plan/Recommendations:  Glad to hear you are doing well.  Your sleep study shows that when you wear your snore guard your sleep apnea is well-controlled.  Continue this device.  I reviewed the recommendations and blood work done by Dr. Onesimo.  Based on our conversation today I think it is wise to continue 6 months of full anticoagulation to end September and.  October 1 we should start Eliquis  at the prophylactic dosing which is 2.5 mg twice daily.  I have sent this to your pharmacy.  Please come see me sooner if any issues or concerns about your breathing, sleep or bleeding problems related to the Eliquis .  I am okay if you want to stop your proton pump inhibitor.  I spent 30 minutes on 09/06/2023 in care of this patient including face to face time and non-face to face time spent charting, review of outside records, and coordination of care.    Return to Care: Return in about 1 year (around 09/05/2024).   Verdon Gore, MD Pulmonary and Critical Care Medicine [facility] [phone]

## 2023-09-06 NOTE — Patient Instructions (Addendum)
 It was a pleasure to see you today!  Please schedule follow up with myself in 1 year.  If my schedule is not open yet, we will contact you with a reminder closer to that time. Please call [phone] if you haven't heard from us  a month before, and always call us  sooner if issues or concerns arise. You can also send us  a message through MyChart, but but aware that this is not to be used for urgent issues and it may take up to 5-7 days to receive a reply. Please be aware that you will likely be able to view your results before I have a chance to respond to them. Please give us  5 business days to respond to any non-urgent results.     Glad to hear you are doing well.  Your sleep study shows that when you wear your snore guard your sleep apnea is well-controlled.  Continue this device.  I reviewed the recommendations and blood work done by Dr. Onesimo.  Based on our conversation today I think it is wise to continue 6 months of full anticoagulation to end September and.  October 1 we should start Eliquis  at the prophylactic dosing which is 2.5 mg twice daily.  I have sent this to your pharmacy.  Please come see me sooner if any issues or concerns about your breathing, sleep or bleeding problems related to the Eliquis .  I am okay if you want to stop your proton pump inhibitor.

## 2023-11-23 ENCOUNTER — Other Ambulatory Visit (HOSPITAL_BASED_OUTPATIENT_CLINIC_OR_DEPARTMENT_OTHER): Payer: Self-pay | Admitting: Family Medicine

## 2023-11-23 DIAGNOSIS — M8589 Other specified disorders of bone density and structure, multiple sites: Secondary | ICD-10-CM

## 2023-12-03 ENCOUNTER — Other Ambulatory Visit: Payer: Self-pay | Admitting: Family Medicine

## 2023-12-03 DIAGNOSIS — Z1231 Encounter for screening mammogram for malignant neoplasm of breast: Secondary | ICD-10-CM

## 2024-01-01 ENCOUNTER — Inpatient Hospital Stay: Admission: RE | Admit: 2024-01-01 | Discharge: 2024-01-01

## 2024-01-01 DIAGNOSIS — Z1231 Encounter for screening mammogram for malignant neoplasm of breast: Secondary | ICD-10-CM

## 2024-04-08 ENCOUNTER — Other Ambulatory Visit (HOSPITAL_BASED_OUTPATIENT_CLINIC_OR_DEPARTMENT_OTHER)
